# Patient Record
Sex: Male | Born: 1935 | Race: White | Hispanic: No | Marital: Married | Smoking: Former smoker
Health system: Southern US, Community
[De-identification: ages and names within clinical notes are randomized; demographics above are authoritative.]

## PROBLEM LIST (undated history)

## (undated) DIAGNOSIS — I10 Essential (primary) hypertension: Secondary | ICD-10-CM

## (undated) DIAGNOSIS — E785 Hyperlipidemia, unspecified: Secondary | ICD-10-CM

## (undated) DIAGNOSIS — J449 Chronic obstructive pulmonary disease, unspecified: Secondary | ICD-10-CM

## (undated) DIAGNOSIS — I480 Paroxysmal atrial fibrillation: Secondary | ICD-10-CM

## (undated) DIAGNOSIS — I739 Peripheral vascular disease, unspecified: Secondary | ICD-10-CM

## (undated) HISTORY — DX: Paroxysmal atrial fibrillation: I48.0

## (undated) HISTORY — DX: Essential (primary) hypertension: I10

## (undated) HISTORY — DX: Peripheral vascular disease, unspecified: I73.9

## (undated) HISTORY — DX: Hyperlipidemia, unspecified: E78.5

---

## 2011-06-07 ENCOUNTER — Inpatient Hospital Stay (INDEPENDENT_AMBULATORY_CARE_PROVIDER_SITE_OTHER)
Admission: RE | Admit: 2011-06-07 | Discharge: 2011-06-07 | Disposition: A | Discharge status: Home / Self Care | Payer: MEDICARE | Source: Ambulatory Visit | Attending: Family Medicine | Admitting: Family Medicine

## 2011-06-07 ENCOUNTER — Encounter: Payer: Self-pay | Admitting: Family Medicine

## 2011-06-07 DIAGNOSIS — J449 Chronic obstructive pulmonary disease, unspecified: Secondary | ICD-10-CM | POA: Insufficient documentation

## 2011-06-07 DIAGNOSIS — R05 Cough: Secondary | ICD-10-CM

## 2011-06-10 ENCOUNTER — Telehealth (INDEPENDENT_AMBULATORY_CARE_PROVIDER_SITE_OTHER): Payer: Self-pay | Admitting: Emergency Medicine

## 2011-06-29 ENCOUNTER — Ambulatory Visit
Admission: RE | Admit: 2011-06-29 | Discharge: 2011-06-29 | Disposition: A | Discharge status: Home / Self Care | Payer: MEDICARE | Source: Ambulatory Visit | Attending: Emergency Medicine | Admitting: Emergency Medicine

## 2011-06-29 ENCOUNTER — Other Ambulatory Visit: Payer: Self-pay | Admitting: Emergency Medicine

## 2011-06-29 ENCOUNTER — Inpatient Hospital Stay (INDEPENDENT_AMBULATORY_CARE_PROVIDER_SITE_OTHER)
Admission: RE | Admit: 2011-06-29 | Discharge: 2011-06-29 | Disposition: A | Discharge status: Home / Self Care | Payer: MEDICARE | Source: Ambulatory Visit | Attending: Emergency Medicine | Admitting: Emergency Medicine

## 2011-06-29 ENCOUNTER — Encounter: Payer: Self-pay | Admitting: Emergency Medicine

## 2011-06-29 DIAGNOSIS — R05 Cough: Secondary | ICD-10-CM

## 2011-06-29 DIAGNOSIS — J069 Acute upper respiratory infection, unspecified: Secondary | ICD-10-CM

## 2011-06-29 DIAGNOSIS — J449 Chronic obstructive pulmonary disease, unspecified: Secondary | ICD-10-CM

## 2011-12-02 NOTE — Progress Notes (Signed)
Summary: chest congestion(rm4)   Vital Signs:  Patient Profile:   77 Years Old Male CC:      chest congestion Height:     70 inches Weight:      162.8 pounds O2 Sat:      93 % O2 treatment:    Room Air Temp:     97.8 degrees F oral Pulse rate:   84 / minute Resp:     24 per minute BP sitting:   156 / 65  (left arm) Cuff size:   regular  Vitals Entered By: Linton Flemings RN (June 29, 2011 5:04 PM)                  Updated Prior Medication List: SYMBICORT 80-4.5 MCG/ACT AERO (BUDESONIDE-FORMOTEROL FUMARATE)  SPIRIVA HANDIHALER 18 MCG CAPS (TIOTROPIUM BROMIDE MONOHYDRATE)  IOPHEN C-NR 100-10 MG/5ML LIQD (GUAIFENESIN-CODEINE)  PREDNISONE (PAK) 10 MG TABS (PREDNISONE) Take as directed: 6, 5, 4, 3, 2, 1 CHERATUSSIN AC 100-10 MG/5ML SYRP (GUAIFENESIN-CODEINE) 5 to 10 cc by mouth hs as needed cough prednisone started 06/28/11 Current Allergies: No known allergies History of Present Illness History from: patient Chief Complaint: chest congestion History of Present Illness: 77 Years Old Male complains of onset of cold symptoms for a week.  His PCP gave him a steroid shot 2 days ago and started him on Prednisone yesterday but he was reading online and is concerned with pneumonia.  He was also given a Zpak and started that this morning.  He is already taking his COPD meds (spiriva, symbicort, and rescue inhaler as needed). No sore throat + cough No pleuritic pain No wheezing + nasal congestion No post-nasal drainage No sinus pain/pressure +chest congestion No itchy/red eyes No earache No hemoptysis No SOB No chills/sweats + mild fever last 2 nights No nausea No vomiting No abdominal pain No diarrhea No skin rashes No fatigue No myalgias No headache   REVIEW OF SYSTEMS Constitutional Symptoms      Denies fever, chills, night sweats, weight loss, weight gain, and fatigue.  Eyes       Denies change in vision, eye pain, eye discharge, glasses, contact lenses, and eye  surgery. Ear/Nose/Throat/Mouth       Denies hearing loss/aids, change in hearing, ear pain, ear discharge, dizziness, frequent runny nose, frequent nose bleeds, sinus problems, sore throat, hoarseness, and tooth pain or bleeding.  Respiratory       Complains of productive cough.      Denies dry cough, wheezing, shortness of breath, asthma, bronchitis, and emphysema/COPD.  Cardiovascular       Denies murmurs, chest pain, and tires easily with exhertion.    Gastrointestinal       Denies stomach pain, nausea/vomiting, diarrhea, constipation, blood in bowel movements, and indigestion. Genitourniary       Denies painful urination, kidney stones, and loss of urinary control. Neurological       Denies paralysis, seizures, and fainting/blackouts. Musculoskeletal       Denies muscle pain, joint pain, joint stiffness, decreased range of motion, redness, swelling, muscle weakness, and gout.  Skin       Denies bruising, unusual mles/lumps or sores, and hair/skin or nail changes.  Psych       Denies mood changes, temper/anger issues, anxiety/stress, speech problems, depression, and sleep problems. Other Comments: symptoms started Wed. seen by PCP and started prednisone on Friday w/out relief   Past History:  Past Medical History: Reviewed history from 06/07/2011 and no changes required. COPD  Past  Surgical History: Reviewed history from 06/07/2011 and no changes required. Denies surgical history  Family History: Reviewed history from 06/07/2011 and no changes required. Father, D, colon CA Physical Exam General appearance: well developed, well nourished, no acute distress, hard of hearing Ears: normal, no lesions or deformities Nasal: mucosa pink, nonedematous, no septal deviation, turbinates normal Oral/Pharynx: tongue normal, posterior pharynx without erythema or exudate Chest/Lungs: no rales, wheezes, or rhonchi bilateral, breath sounds equal without effort Heart: regular rate and   rhythm, no murmur MSE: oriented to time, place, and person Assessment New Problems: UPPER RESPIRATORY INFECTION, ACUTE (ICD-465.9) COUGH (ICD-786.2)   Patient Education: Patient and/or caregiver instructed in the following: rest.  Plan New Orders: Est. Patient Level IV [16109] T-Chest x-ray, 2 views [71020] Pulse Oximetry (single measurment) [94760] Planning Comments:   A CXR was ordered which shows Emphysema without acute finding. His PCP already has him on the correct medicines.   Use nasal saline solution (over the counter) at least 3 times a day.Can use OTC decongestants as needed. Can take tylenol or motrin for pain or fever. Follow up with your primary doctor  if no improvement in 5-7 days, sooner if increasing pain, fever, or new symptoms.    The patient and/or caregiver has been counseled thoroughly with regard to medications prescribed including dosage, schedule, interactions, rationale for use, and possible side effects and they verbalize understanding.  Diagnoses and expected course of recovery discussed and will return if not improved as expected or if the condition worsens. Patient and/or caregiver verbalized understanding.   Orders Added: 1)  Est. Patient Level IV [60454] 2)  T-Chest x-ray, 2 views [71020] 3)  Pulse Oximetry (single measurment) [09811]

## 2011-12-02 NOTE — Progress Notes (Signed)
Summary: CHEST CONGESTION COPD Room 5   Vital Signs:  Patient Profile:   77 Years Old Male CC:      COPD/Troble breathing, mucus x 2 days Height:     70 inches Weight:      164 pounds O2 Sat:      97 % O2 treatment:    Room Air Temp:     98.2 degrees F oral Pulse rate:   88 / minute Pulse rhythm:   regular Resp:     20 per minute BP sitting:   201 / 87  (left arm) Cuff size:   regular  Vitals Entered By: Emilio Math (June 07, 2011 3:14 PM)                  Current Allergies: No known allergies History of Present Illness Chief Complaint: COPD/Troble breathing, mucus x 2 days History of Present Illness:  Subjective:  Patient complains of a history of COPD, worse over the past year but generally controlled with Symbicort, Spiriva, and Ventolin inhalers.  On June 3, he had worsening congestion at night and his PCP treated him with a 6 day course of tapering prednisone.  He improved siginificantly but over the past two nights he has had increased cough,  mucous production and dyspnea at night, and would like to extend his prednisone treatment.  He uses O2 at night (1 to 2 L/M).   He reports much less chest congestion daytime.  No fevers, chills, and sweats.  No pleuritic pain.    REVIEW OF SYSTEMS Constitutional Symptoms      Denies fever, chills, night sweats, weight loss, weight gain, and fatigue.  Eyes       Denies change in vision, eye pain, eye discharge, glasses, contact lenses, and eye surgery. Ear/Nose/Throat/Mouth       Denies hearing loss/aids, change in hearing, ear pain, ear discharge, dizziness, frequent runny nose, frequent nose bleeds, sinus problems, sore throat, hoarseness, and tooth pain or bleeding.  Respiratory       Complains of productive cough, wheezing, shortness of breath, bronchitis, and emphysema/COPD.      Denies dry cough and asthma.  Cardiovascular       Denies murmurs, chest pain, and tires easily with exhertion.    Gastrointestinal  Denies stomach pain, nausea/vomiting, diarrhea, constipation, blood in bowel movements, and indigestion. Genitourniary       Denies painful urination, kidney stones, and loss of urinary control. Neurological       Denies paralysis, seizures, and fainting/blackouts. Musculoskeletal       Denies muscle pain, joint pain, joint stiffness, decreased range of motion, redness, swelling, muscle weakness, and gout.  Skin       Denies bruising, unusual mles/lumps or sores, and hair/skin or nail changes.  Psych       Denies mood changes, temper/anger issues, anxiety/stress, speech problems, depression, and sleep problems.  Past History:  Past Medical History: COPD  Past Surgical History: Denies surgical history  Family History: Father, D, colon CA  Social History: Quit smoker 10 yrs ago, smoked 50 yrs No ETOH No DRugs Retired   Objective:  Appearance:  Patient appears stated age, and in no acute distress.  He is alert and oriented  Eyes:  Pupils are equal, round, and reactive to light and accomodation.  Extraocular movement is intact.  Conjunctivae are not inflamed.  Ears:  Canals normal.  Tympanic membranes normal.   Nose:  Mildly congested turbinates.  No sinus tenderness  Pharynx:  Normal  Neck:  Supple.  No adenopathy is present.   Lungs:  Generally clear to auscultation.  Breath sounds are equal.  Heart:  Regular rate and rhythm without murmurs, rubs, or gallops.  Abdomen:  Nontender without masses or hepatosplenomegaly.  Bowel sounds are present.  No CVA or flank tenderness.  Extremities:  No edema.  Pedal pulses are full and equal.  Assessment New Problems: COUGH (ICD-786.2) COPD (ICD-496)   Plan New Medications/Changes: CHERATUSSIN AC 100-10 MG/5ML SYRP (GUAIFENESIN-CODEINE) 5 to 10 cc by mouth hs as needed cough  #4oz x 0, 06/07/2011, Donna Christen MD PREDNISONE (PAK) 10 MG TABS (PREDNISONE) Take as directed: 6, 5, 4, 3, 2, 1  #21 tabs x 0, 06/07/2011, Donna Christen  MD  New Orders: Depo- Medrol 80mg  [J1040] Admin of Therapeutic Inj  intramuscular or subcutaneous [96372] Pulse Oximetry (single measurment) [94760] New Patient Level IV [16109] Planning Comments:   Depo Medrol 80mg  IM; tomorrow begin tapering course of prednisone.  Recommend Mucinex Continue inhalers. Follow-up with PCP  The patient and/or caregiver has been counseled thoroughly with regard to medications prescribed including dosage, schedule, interactions, rationale for use, and possible side effects and they verbalize understanding.  Diagnoses and expected course of recovery discussed and will return if not improved as expected or if the condition worsens. Patient and/or caregiver verbalized understanding.  Prescriptions: CHERATUSSIN AC 100-10 MG/5ML SYRP (GUAIFENESIN-CODEINE) 5 to 10 cc by mouth hs as needed cough  #4oz x 0   Entered and Authorized by:   Donna Christen MD   Signed by:   Donna Christen MD on 06/07/2011   Method used:   Print then Give to Patient   RxID:   [id number] PREDNISONE (PAK) 10 MG TABS (PREDNISONE) Take as directed: 6, 5, 4, 3, 2, 1  #21 tabs x 0   Entered and Authorized by:   Donna Christen MD   Signed by:   Donna Christen MD on 06/07/2011   Method used:   Print then Give to Patient   RxID:   [id number]   Medication Administration  Injection # 1:    Medication: Depo- Medrol 80mg     Diagnosis: COPD (ICD-496)    Route: IM    Site: RUOQ gluteus    Exp Date: 06/30/2011    Lot #: [id number] [id number]    Mfr: Pharmacia    Given by: Emilio Math (June 07, 2011 3:56 PM)  Orders Added: 1)  Depo- Medrol 80mg  [J1040] 2)  Admin of Therapeutic Inj  intramuscular or subcutaneous [96372] 3)  Pulse Oximetry (single measurment) [94760] 4)  New Patient Level IV [99204]     Medication Administration  Injection # 1:    Medication: Depo- Medrol 80mg     Diagnosis: COPD (ICD-496)    Route: IM    Site: RUOQ gluteus    Exp Date: 06/30/2011    Lot #: [id number] [id number]    Mfr: Pharmacia    Given by: Emilio Math (June 07, 2011 3:56 PM)  Orders Added: 1)  Depo- Medrol 80mg  [J1040] 2)  Admin of Therapeutic Inj  intramuscular or subcutaneous [96372] 3)  Pulse Oximetry (single measurment) [94760] 4)  New Patient Level IV [84696]

## 2011-12-02 NOTE — Telephone Encounter (Signed)
  Phone Note Outgoing Call Call back at [facility] Phone [phone]   Call placed by: Emilio Math,  June 10, 2011 12:15 PM Call placed to: Patient Summary of Call: Patient is doing fine, appreciated the care.

## 2012-07-06 ENCOUNTER — Ambulatory Visit: Payer: Medicare Other | Attending: Family Medicine | Admitting: Rehabilitative and Restorative Service Providers"

## 2012-07-06 DIAGNOSIS — R269 Unspecified abnormalities of gait and mobility: Secondary | ICD-10-CM | POA: Insufficient documentation

## 2012-07-06 DIAGNOSIS — IMO0001 Reserved for inherently not codable concepts without codable children: Secondary | ICD-10-CM | POA: Insufficient documentation

## 2012-07-10 ENCOUNTER — Ambulatory Visit: Payer: Medicare Other | Admitting: Rehabilitative and Restorative Service Providers"

## 2012-07-13 ENCOUNTER — Encounter: Payer: MEDICARE | Admitting: Rehabilitative and Restorative Service Providers"

## 2012-07-20 ENCOUNTER — Encounter: Payer: MEDICARE | Admitting: Rehabilitative and Restorative Service Providers"

## 2013-02-20 ENCOUNTER — Emergency Department
Admission: EM | Admit: 2013-02-20 | Discharge: 2013-02-20 | Disposition: A | Discharge status: Home / Self Care | Payer: Medicare Other | Source: Home / Self Care | Attending: Family Medicine | Admitting: Family Medicine

## 2013-02-20 DIAGNOSIS — J441 Chronic obstructive pulmonary disease with (acute) exacerbation: Secondary | ICD-10-CM

## 2013-02-20 DIAGNOSIS — J209 Acute bronchitis, unspecified: Secondary | ICD-10-CM

## 2013-02-20 HISTORY — DX: Chronic obstructive pulmonary disease, unspecified: J44.9

## 2013-02-20 MED ORDER — METHYLPREDNISOLONE ACETATE 80 MG/ML IJ SUSP
80.0000 mg | Freq: Once | INTRAMUSCULAR | Status: AC
  Administered 2013-02-20: 80 mg via INTRAMUSCULAR

## 2013-02-20 MED ORDER — PREDNISONE (PAK) 10 MG PO TABS: ORAL_TABLET | ORAL | Status: DC

## 2013-02-20 MED ORDER — DOXYCYCLINE HYCLATE 100 MG PO CAPS: 100.0000 mg | ORAL_CAPSULE | Freq: Two times a day (BID) | ORAL | Status: DC

## 2013-02-20 NOTE — ED Provider Notes (Signed)
History     CSN: [id number]  Arrival date & time 02/20/13  1453   First MD Initiated Contact with Patient 02/20/13 1533      Chief Complaint  Patient presents with  . Shortness of Breath       HPI Comments: Patient complains of increased cough and shortness of breath for 3 days.  He has had increased production of tan sputum.  No fevers, chills, and sweats but he has been more fatigued past several days.  No pleuritic pain.  No other URI symptoms.  His last exacerbation of COPD was one month ago.  The history is provided by the patient and the spouse.    Past Medical History  Diagnosis Date  . COPD (chronic obstructive pulmonary disease)     History reviewed. No pertinent past surgical history.  Family History  Problem Relation Age of Onset  . Heart failure Mother   . Cancer Father     History  Substance Use Topics  . Smoking status: Former Games developer  . Smokeless tobacco: Not on file  . Alcohol Use: No      Review of Systems No sore throat + cough No pleuritic pain No wheezing No nasal congestion ? post-nasal drainage No sinus pain/pressure No itchy/red eyes No earache No hemoptysis + SOB No fever/chills No nausea No vomiting No abdominal pain No diarrhea No urinary symptoms No skin rashes + fatigue No myalgias No headache    Allergies  Review of patient's allergies indicates no active allergies.  Home Medications   Current Outpatient Rx  Name  Route  Sig  Dispense  Refill  . albuterol (PROVENTIL HFA;VENTOLIN HFA) 108 (90 BASE) MCG/ACT inhaler   Inhalation   Inhale 2 puffs into the lungs every 6 (six) hours as needed for wheezing.         . budesonide-formoterol (SYMBICORT) 80-4.5 MCG/ACT inhaler   Inhalation   Inhale 2 puffs into the lungs 2 (two) times daily.         Marland Kitchen tiotropium (SPIRIVA) 18 MCG inhalation capsule   Inhalation   Place 18 mcg into inhaler and inhale daily.         Marland Kitchen doxycycline (VIBRAMYCIN) 100 MG capsule    Oral   Take 1 capsule (100 mg total) by mouth 2 (two) times daily.   20 capsule   0   . predniSONE (STERAPRED UNI-PAK) 10 MG tablet      Take by mouth with food over 6 days as follows:  6, 5, 4, 3, 2, 1   21 tablet   0     BP 153/73  Pulse 87  Temp(Src) 98.1 F (36.7 C) (Oral)  Ht 5\' 10"  (1.778 m)  Wt 153 lb 8 oz (69.627 kg)  BMI 22.02 kg/m2  SpO2 95%  Physical Exam Nursing notes and Vital Signs reviewed. Appearance:  Patient appears stated age, and in no acute distress.  He has decreased hearing. Eyes:  Pupils are equal, round, and reactive to light and accomodation.  Extraocular movement is intact.  Conjunctivae are not inflamed  Ears:  Canals normal.  Tympanic membranes normal.  Nose:  Normal turbinates.  No sinus tenderness.  Pharynx:  Normal Neck:  Supple.  No adenopathy Lungs:  Clear to auscultation, but breath sounds are tubular.  Breath sounds are equal.  Heart:  Regular rate and rhythm without murmurs, rubs, or gallops.  Abdomen:  Nontender without masses or hepatosplenomegaly.  Bowel sounds are present.  No CVA or flank tenderness.  Extremities:  No edema.  No calf tenderness Skin:  No rash present.   ED Course  Procedures  none      1. COPD exacerbation   2. Acute bronchitis       MDM  DepoMedrol 80mg  IM Begin Doxycycline, and tomorrow begin 6 day Prednisone 10mg  pack  Continue all inhalers as prescribed.  May take plain Mucinex daytime. If symptoms become significantly worse during the night or over the weekend, proceed to the local emergency room Followup with PCP        Lattie Haw, MD 02/20/13 1626

## 2013-02-20 NOTE — ED Notes (Signed)
Hx. Of COPD, states increased SHOB started 3-4 days without fever

## 2013-03-01 ENCOUNTER — Ambulatory Visit: Payer: Medicare Other | Admitting: Physician Assistant

## 2013-03-03 ENCOUNTER — Encounter: Payer: Self-pay | Admitting: Physician Assistant

## 2013-03-03 ENCOUNTER — Ambulatory Visit (INDEPENDENT_AMBULATORY_CARE_PROVIDER_SITE_OTHER): Payer: Medicare Other | Admitting: Physician Assistant

## 2013-03-03 VITALS — BP 130/74 | HR 84 | Ht 70.0 in | Wt 155.0 lb

## 2013-03-03 DIAGNOSIS — J441 Chronic obstructive pulmonary disease with (acute) exacerbation: Secondary | ICD-10-CM

## 2013-03-03 DIAGNOSIS — I499 Cardiac arrhythmia, unspecified: Secondary | ICD-10-CM

## 2013-03-03 DIAGNOSIS — I4891 Unspecified atrial fibrillation: Secondary | ICD-10-CM

## 2013-03-03 MED ORDER — BUDESONIDE-FORMOTEROL FUMARATE 80-4.5 MCG/ACT IN AERO: 2.0000 | INHALATION_SPRAY | Freq: Two times a day (BID) | RESPIRATORY_TRACT | Status: DC

## 2013-03-03 MED ORDER — ALBUTEROL SULFATE HFA 108 (90 BASE) MCG/ACT IN AERS: 2.0000 | INHALATION_SPRAY | Freq: Four times a day (QID) | RESPIRATORY_TRACT | Status: DC | PRN

## 2013-03-03 MED ORDER — TIOTROPIUM BROMIDE MONOHYDRATE 18 MCG IN CAPS: 18.0000 ug | ORAL_CAPSULE | Freq: Every day | RESPIRATORY_TRACT | Status: DC

## 2013-03-03 NOTE — Patient Instructions (Addendum)
Needs to go back to cardiologist [name].   Medications refill.

## 2013-03-04 NOTE — Progress Notes (Signed)
  Subjective:    Patient ID: Kenneth Lin, male    DOB: [birth year], 79 y.o.   MRN: [id number]  HPI Patient presents to the clinic to establish care. He is a 79 yo male who is very pleasant. PMH positive for COPD and recent atrial fibrillation. He is here to confirm heart rate is still irregular and to get refills on medication.   COPD is well controlled today. He does have a few exacerbations a year. His most recent exacerbation he went to our Urgent Care. He uses his inhaler regularly. On average he does not need albuterol inhaler maybe once a week. Weather changes tend to affect his breathing the most. He tries to stay active.   Recently he was told he has atrial fibrillation. He went to a cardiologist and they wanted to do a procedure and start on anti coagulation. He is not on board with this treatment. He did start a daily ASA. He denies any SOB. Sometimes it does feel like his heart is fluttering. Denies any weakness of extremities.   Family, social, surgical history was reviewed.   Never had colonoscopy and does not want one.     Review of Systems  All other systems reviewed and are negative.       Objective:   Physical Exam  Constitutional: He is oriented to person, place, and time. He appears well-developed and well-nourished.  HENT:  Head: Normocephalic and atraumatic.  Right Ear: External ear normal.  Left Ear: External ear normal.  Eyes: Conjunctivae are normal. Right eye exhibits no discharge. Left eye exhibits no discharge.  Neck: Normal range of motion. Neck supple. No thyromegaly present.  Cardiovascular: Normal heart sounds.   Irregular Irregular rate.  Pulmonary/Chest: Effort normal.  Diminished Breath sounds bilaterally.  Lymphadenopathy:    He has no cervical adenopathy.  Neurological: He is alert and oriented to person, place, and time.  Skin: Skin is warm and dry.  Psychiatric: He has a normal mood and affect. His behavior is normal.           Assessment & Plan:  COPD- Pt well controlled today. Refilled all meds. Will continue to monitor. Follow up in 3 months.   Atrial fibrillation- Pt aware. He has seen a cardiologist, [name]. Per patient cardiologist is wanting him to start anti coagulation but patient does not want too. He is taking a daily ASA 81mg . Encouraged pt to schedule appt in near future and that I think the idea of anticoagulation is a good idea to prevent clot. Risk of not anti-coagulating was discussed with patient.

## 2013-03-10 ENCOUNTER — Encounter: Payer: Self-pay | Admitting: Physician Assistant

## 2013-03-10 DIAGNOSIS — I739 Peripheral vascular disease, unspecified: Secondary | ICD-10-CM | POA: Insufficient documentation

## 2013-03-10 DIAGNOSIS — I10 Essential (primary) hypertension: Secondary | ICD-10-CM | POA: Insufficient documentation

## 2013-03-10 DIAGNOSIS — I480 Paroxysmal atrial fibrillation: Secondary | ICD-10-CM | POA: Insufficient documentation

## 2013-03-10 DIAGNOSIS — E785 Hyperlipidemia, unspecified: Secondary | ICD-10-CM | POA: Insufficient documentation

## 2013-05-23 ENCOUNTER — Encounter: Payer: Self-pay | Admitting: Emergency Medicine

## 2013-05-23 ENCOUNTER — Emergency Department
Admission: EM | Admit: 2013-05-23 | Discharge: 2013-05-23 | Disposition: A | Discharge status: Home / Self Care | Payer: Medicare Other | Source: Home / Self Care | Attending: Family Medicine | Admitting: Family Medicine

## 2013-05-23 DIAGNOSIS — J441 Chronic obstructive pulmonary disease with (acute) exacerbation: Secondary | ICD-10-CM

## 2013-05-23 DIAGNOSIS — J069 Acute upper respiratory infection, unspecified: Secondary | ICD-10-CM

## 2013-05-23 MED ORDER — DOXYCYCLINE HYCLATE 100 MG PO CAPS: 100.0000 mg | ORAL_CAPSULE | Freq: Two times a day (BID) | ORAL | Status: AC

## 2013-05-23 MED ORDER — METHYLPREDNISOLONE SODIUM SUCC 125 MG IJ SOLR
125.0000 mg | Freq: Once | INTRAMUSCULAR | Status: AC
  Administered 2013-05-23: 125 mg via INTRAMUSCULAR

## 2013-05-23 MED ORDER — IPRATROPIUM BROMIDE 0.02 % IN SOLN
0.5000 mg | Freq: Once | RESPIRATORY_TRACT | Status: AC
  Administered 2013-05-23: 0.5 mg via RESPIRATORY_TRACT

## 2013-05-23 MED ORDER — PREDNISONE 50 MG PO TABS: ORAL_TABLET | ORAL | Status: DC

## 2013-05-23 NOTE — ED Notes (Signed)
Reports exacerbation of COPD x 24 hours; wonders if allergies contributing.

## 2013-05-23 NOTE — ED Provider Notes (Signed)
History     CSN: [id number]  Arrival date & time 05/23/13  1706   First MD Initiated Contact with Patient 05/23/13 1711      Chief Complaint  Patient presents with  . Shortness of Breath   HPI  URI Symptoms Onset: 2 days  Description: rhinorrhea, nasal congestion, cough, wheezing, increased sputum production.  Modifying factors:  Baseline COPD   Symptoms Nasal discharge: yes Fever: no Sore throat: no Cough: yes Wheezing: yes Ear pain: no GI symptoms: no Sick contacts: no  Red Flags  Stiff neck: no Dyspnea: mild Rash: no Swallowing difficulty: no  Sinusitis Risk Factors Headache/face pain: no Double sickening: no tooth pain: no  Allergy Risk Factors Sneezing: no Itchy scratchy throat: no Seasonal symptoms: no  Flu Risk Factors Headache: no muscle aches: no severe fatigue: no   Past Medical History  Diagnosis Date  . COPD (chronic obstructive pulmonary disease)     History reviewed. No pertinent past surgical history.  Family History  Problem Relation Age of Onset  . Heart failure Mother   . Cancer Father     History  Substance Use Topics  . Smoking status: Former Games developer  . Smokeless tobacco: Not on file  . Alcohol Use: No      Review of Systems  All other systems reviewed and are negative.    Allergies  Review of patient's allergies indicates no known allergies.  Home Medications   Current Outpatient Rx  Name  Route  Sig  Dispense  Refill  . albuterol (PROVENTIL HFA;VENTOLIN HFA) 108 (90 BASE) MCG/ACT inhaler   Inhalation   Inhale 2 puffs into the lungs every 6 (six) hours as needed for wheezing.   1 Inhaler   6   . budesonide-formoterol (SYMBICORT) 80-4.5 MCG/ACT inhaler   Inhalation   Inhale 2 puffs into the lungs 2 (two) times daily.   1 Inhaler   5   . tiotropium (SPIRIVA) 18 MCG inhalation capsule   Inhalation   Place 1 capsule (18 mcg total) into inhaler and inhale daily.   30 capsule   5     BP 171/93   Pulse 96  Temp(Src) 97.7 F (36.5 C) (Oral)  Resp 20  Ht 5\' 10"  (1.778 m)  Wt 155 lb (70.308 kg)  BMI 22.24 kg/m2  SpO2 98%  Physical Exam  Constitutional: He appears well-developed and well-nourished.  HENT:  Head: Normocephalic and atraumatic.  Right Ear: External ear normal.  Left Ear: External ear normal.  +nasal erythema, rhinorrhea bilaterally, + post oropharyngeal erythema    Eyes: Conjunctivae are normal. Pupils are equal, round, and reactive to light.  Neck: Normal range of motion. Neck supple.  Cardiovascular: Normal rate, regular rhythm and normal heart sounds.   Pulmonary/Chest: Effort normal. No respiratory distress. He has wheezes. He has no rales.  Abdominal: Soft.  Musculoskeletal: Normal range of motion.  Neurological: He is alert.  Skin: Skin is warm.    ED Course  Procedures (including critical care time)  Labs Reviewed - No data to display No results found.   1. COPD exacerbation   2. URI (upper respiratory infection)       MDM  Likely viral induced COPD exacerbation.  Solumedrol 125mg  IM x1 Prednisone and doxy for outpt treatment.  Atrovent neb x1 (avoiding excessive tachycardia in setting of borderline tachycardic HR w/ ? Hx/o afib).  Noticed improved aeration s/p neg treatment. HR remained stable.  Discussed supportive care and resp/infectious red flags.  Call if  any questions.  Follow up as needed.     The patient and/or caregiver has been counseled thoroughly with regard to treatment plan and/or medications prescribed including dosage, schedule, interactions, rationale for use, and possible side effects and they verbalize understanding. Diagnoses and expected course of recovery discussed and will return if not improved as expected or if the condition worsens. Patient and/or caregiver verbalized understanding.             Doree Albee, MD 05/23/13 [phone]

## 2013-05-28 ENCOUNTER — Encounter: Payer: Self-pay | Admitting: Physician Assistant

## 2013-05-28 ENCOUNTER — Ambulatory Visit (INDEPENDENT_AMBULATORY_CARE_PROVIDER_SITE_OTHER): Payer: Medicare Other | Admitting: Physician Assistant

## 2013-05-28 VITALS — BP 156/99 | HR 63 | Wt 158.0 lb

## 2013-05-28 DIAGNOSIS — M25559 Pain in unspecified hip: Secondary | ICD-10-CM

## 2013-05-28 DIAGNOSIS — J449 Chronic obstructive pulmonary disease, unspecified: Secondary | ICD-10-CM

## 2013-05-28 DIAGNOSIS — M25551 Pain in right hip: Secondary | ICD-10-CM

## 2013-05-28 DIAGNOSIS — Z131 Encounter for screening for diabetes mellitus: Secondary | ICD-10-CM

## 2013-05-28 DIAGNOSIS — R1013 Epigastric pain: Secondary | ICD-10-CM

## 2013-05-28 DIAGNOSIS — Z13220 Encounter for screening for lipoid disorders: Secondary | ICD-10-CM

## 2013-05-28 MED ORDER — DIAZEPAM 5 MG PO TABS: 5.0000 mg | ORAL_TABLET | Freq: Every evening | ORAL | Status: DC | PRN

## 2013-05-28 NOTE — Patient Instructions (Addendum)
Continue on inhalers.   Start zyrtec or claritin 24 hour for allergies.   Ok nexium OTC.

## 2013-05-28 NOTE — Progress Notes (Signed)
  Subjective:    Patient ID: Kenneth Lin, male    DOB: 1930/04/21, 79 y.o.   MRN: [id number]  HPI Patient presents to the clinic to follow up after his COPD exacerbation. He feels much better and breathing great. Remains on Spiriva and symbicort. Not using albuterol inhaler currently. Still taking antibiotic.   He does have some epigastric discomfort after eating big meals. He used to be on prilosec and did help. He wants to know if nexium would be ok to seeling much better.Denies any n/v/d. No coughing or blood.   Patient also comes in talking about symptoms of blood clot in right hip. He read an article about the only symptom being burning. He has had burning for over a year. Not taking anything to make better. Nothing makes worse. He would like a ultrasound of hip to look for clot. Taking ASA daily. At some point he was given gabapentin and it did help the pain.    Review of Systems     Objective:   Physical Exam  Constitutional: He is oriented to person, place, and time. He appears well-developed and well-nourished.  HENT:  Head: Normocephalic and atraumatic.  Eyes: Conjunctivae are normal.  Neck: Normal range of motion. Neck supple.  Cardiovascular: Normal rate, regular rhythm and normal heart sounds.   Pulmonary/Chest: Effort normal and breath sounds normal.  Abdominal: Soft. Bowel sounds are normal. He exhibits no distension and no mass. There is no tenderness. There is no rebound and no guarding.  Musculoskeletal:  Right hip: no swelling or heat. No pain with palpation. Full ROM. Strength 5/5.   Neurological: He is alert and oriented to person, place, and time.  Skin: Skin is warm and dry.  Psychiatric: He has a normal mood and affect. His behavior is normal.          Assessment & Plan:  COPD- Continue on meds. Controlled today. Follow up if starting to use rescue inhaler more.   Afib- Encouraged pt to see cardiologist.   GERD- Ok to use OTC nexium see if helps  epigastric discomfort. Follow up if not improving.  Right hip burning- Discussed not likely a clot since no swelling, heat and responding to neurotin. Let's get d-dimer and if elevated can get ultrasound if not elevated will hold off and can start gabapentin again or other nerve medication. Continue on ASA.  Lab slip given to have screening labs done.    Spent 30 minutes with patient and greater than 50 percent of time spent counseling regarding blood clots and treatment.

## 2013-05-29 LAB — D-DIMER, QUANTITATIVE: D-Dimer, Quant: 0.46 ug/mL-FEU (ref 0.00–0.48)

## 2013-06-04 ENCOUNTER — Ambulatory Visit: Payer: Medicare Other | Admitting: Physician Assistant

## 2013-06-25 ENCOUNTER — Encounter: Payer: Self-pay | Admitting: Physician Assistant

## 2013-06-25 ENCOUNTER — Ambulatory Visit (INDEPENDENT_AMBULATORY_CARE_PROVIDER_SITE_OTHER): Payer: Medicare Other | Admitting: Physician Assistant

## 2013-06-25 VITALS — BP 150/84 | HR 88

## 2013-06-25 DIAGNOSIS — J441 Chronic obstructive pulmonary disease with (acute) exacerbation: Secondary | ICD-10-CM

## 2013-06-25 DIAGNOSIS — I10 Essential (primary) hypertension: Secondary | ICD-10-CM

## 2013-06-25 DIAGNOSIS — H60399 Other infective otitis externa, unspecified ear: Secondary | ICD-10-CM

## 2013-06-25 DIAGNOSIS — I4891 Unspecified atrial fibrillation: Secondary | ICD-10-CM

## 2013-06-25 DIAGNOSIS — H6092 Unspecified otitis externa, left ear: Secondary | ICD-10-CM

## 2013-06-25 MED ORDER — PREDNISONE 50 MG PO TABS: ORAL_TABLET | ORAL | Status: DC

## 2013-06-25 MED ORDER — DOXYCYCLINE HYCLATE 100 MG PO CAPS: 100.0000 mg | ORAL_CAPSULE | Freq: Two times a day (BID) | ORAL | Status: DC

## 2013-06-25 MED ORDER — BUDESONIDE-FORMOTEROL FUMARATE 160-4.5 MCG/ACT IN AERO: 2.0000 | INHALATION_SPRAY | Freq: Two times a day (BID) | RESPIRATORY_TRACT | Status: DC

## 2013-06-25 MED ORDER — CIPROFLOXACIN-DEXAMETHASONE 0.3-0.1 % OT SUSP: 4.0000 [drp] | Freq: Two times a day (BID) | OTIC | Status: DC

## 2013-06-25 MED ORDER — METHYLPREDNISOLONE SODIUM SUCC 125 MG IJ SOLR
125.0000 mg | Freq: Once | INTRAMUSCULAR | Status: AC
  Administered 2013-06-25: 125 mg via INTRAMUSCULAR

## 2013-06-25 MED ORDER — ASPIRIN EC 81 MG PO TBEC: 81.0000 mg | DELAYED_RELEASE_TABLET | Freq: Every day | ORAL | Status: DC

## 2013-06-25 NOTE — Progress Notes (Signed)
  Subjective:    Patient ID: Kenneth Lin, male    DOB: [birth year], 79 y.o.   MRN: [id number]  HPI Patient is a 79 yo male who presents to the clinic with left ear pain/congestion/pressure and difficultly breathing.   Left ear pain/congestion/pressure has been going on for around a week. Denies any fever, chills, nausea, vomiting diarrhea. Denies any sinus pressure. Ear aches to touch left ear. Denies any discharge.   Pt has COPD and feels like a flare has started. He feels like he has had more flares since decreasing symbicort down to 80. He has noticed he has started wheezing and chest feeling tight. He rarely uses albuterol nebulizer unless he is in a flare and he has had to use it 3 times yesterday. He is very compliant with spiriva and symbicort daily. No fever. Cough is productive with a whitish sputum.   Atrial fibrillation is still ongoing and patient has not been seen by cardiologist. He does not want to go back to cardiologist at [facility]. He denies any symptoms of chest pain, palpitations, difficultly completing tasks.     Review of Systems     Objective:   Physical Exam  Constitutional: He is oriented to person, place, and time. He appears well-developed and well-nourished.  HENT:  Head: Normocephalic and atraumatic.  Right Ear: External ear normal.  Nose: Nose normal.  Mouth/Throat: Oropharynx is clear and moist. No oropharyngeal exudate.  Left TM erythematous with a slight bulge. Canal is red and tragus tender to touch.   Neck: Normal range of motion. Neck supple.  Cardiovascular: Normal heart sounds.   Afib noted on exam. Irregular Irregular beat.   Pulmonary/Chest:  Decreased breath sounds. Wheezing at base of both lungs.   Abdominal: Soft. Bowel sounds are normal. There is no tenderness.  Lymphadenopathy:    He has no cervical adenopathy.  Neurological: He is alert and oriented to person, place, and time.  Skin: Skin is warm and dry.  Psychiatric: He has a  normal mood and affect. His behavior is normal.          Assessment & Plan:  Left external otitis- Treated with ciprodex 4 drops twice a day for 7 days. Gave HO on ways to help pressure and pain.   COPD exacerbation- Given Solumedrol 125mg  in office. Prednisone burst given. Doxy for 10 days. Continue to use albuterol inhaler as needed. Increased symbicort to 160 2 puffs BID since exacerbations have been more frequent. Follow up in 1 month. Samples given to assist patient.   HTN- Pt's BP is elevated again today. He claims white coat syndrome. I would like to recheck in 1 month. He is also going to start taking BP on a regular basis and bring in log. He is also going to bring in BP machine to check accuracy.   A fib- Discussed with patient importance of follow up with cardiologist. He agreed to go downstairs to see Dr. Jens Som. Will refer. Continues on ASA.

## 2013-06-25 NOTE — Patient Instructions (Addendum)
Will get in with dr. Jens Som for afib.  Increased symbicort to 160 2 puffs twice a day.   Otitis Externa Otitis externa is a bacterial or fungal infection of the outer ear canal. This is the area from the eardrum to the outside of the ear. Otitis externa is sometimes called "swimmer's ear." CAUSES  Possible causes of infection include:  Swimming in dirty water.  Moisture remaining in the ear after swimming or bathing.  Mild injury (trauma) to the ear.  Objects stuck in the ear (foreign body).  Cuts or scrapes (abrasions) on the outside of the ear. SYMPTOMS  The first symptom of infection is often itching in the ear canal. Later signs and symptoms may include swelling and redness of the ear canal, ear pain, and yellowish-white fluid (pus) coming from the ear. The ear pain may be worse when pulling on the earlobe. DIAGNOSIS  Your caregiver will perform a physical exam. A sample of fluid may be taken from the ear and examined for bacteria or fungi. TREATMENT  Antibiotic ear drops are often given for 10 to 14 days. Treatment may also include pain medicine or corticosteroids to reduce itching and swelling. PREVENTION   Keep your ear dry. Use the corner of a towel to absorb water out of the ear canal after swimming or bathing.  Avoid scratching or putting objects inside your ear. This can damage the ear canal or remove the protective wax that lines the canal. This makes it easier for bacteria and fungi to grow.  Avoid swimming in lakes, polluted water, or poorly chlorinated pools.  You may use ear drops made of rubbing alcohol and vinegar after swimming. Combine equal parts of white vinegar and alcohol in a bottle. Put 3 or 4 drops into each ear after swimming. HOME CARE INSTRUCTIONS   Apply antibiotic ear drops to the ear canal as prescribed by your caregiver.  Only take over-the-counter or prescription medicines for pain, discomfort, or fever as directed by your caregiver.  If you  have diabetes, follow any additional treatment instructions from your caregiver.  Keep all follow-up appointments as directed by your caregiver. SEEK MEDICAL CARE IF:   You have a fever.  Your ear is still red, swollen, painful, or draining pus after 3 days.  Your redness, swelling, or pain gets worse.  You have a severe headache.  You have redness, swelling, pain, or tenderness in the area behind your ear. MAKE SURE YOU:   Understand these instructions.  Will watch your condition.  Will get help right away if you are not doing well or get worse. Document Released: 12/16/2005 Document Revised: 03/09/2012 Document Reviewed: 01/02/2012 [facility] Patient Information 2014 [place], [place].

## 2013-07-01 ENCOUNTER — Telehealth: Payer: Self-pay | Admitting: Physician Assistant

## 2013-07-01 NOTE — Telephone Encounter (Signed)
Patient called because he has not heard anything about his Card referral and said that you were going to put it in a referral for him to see Dr.Crenshaw. Thanks

## 2013-07-05 ENCOUNTER — Other Ambulatory Visit: Payer: Self-pay | Admitting: Physician Assistant

## 2013-07-05 DIAGNOSIS — I4891 Unspecified atrial fibrillation: Secondary | ICD-10-CM

## 2013-07-05 NOTE — Telephone Encounter (Signed)
Put referral in. 

## 2013-07-23 ENCOUNTER — Encounter: Payer: Self-pay | Admitting: Physician Assistant

## 2013-07-23 ENCOUNTER — Ambulatory Visit (INDEPENDENT_AMBULATORY_CARE_PROVIDER_SITE_OTHER): Payer: Self-pay | Admitting: Physician Assistant

## 2013-07-23 VITALS — BP 173/95 | HR 80 | Wt 163.0 lb

## 2013-07-23 DIAGNOSIS — H9193 Unspecified hearing loss, bilateral: Secondary | ICD-10-CM

## 2013-07-23 DIAGNOSIS — I4891 Unspecified atrial fibrillation: Secondary | ICD-10-CM

## 2013-07-23 DIAGNOSIS — Z13220 Encounter for screening for lipoid disorders: Secondary | ICD-10-CM

## 2013-07-23 DIAGNOSIS — Z79899 Other long term (current) drug therapy: Secondary | ICD-10-CM

## 2013-07-23 DIAGNOSIS — I480 Paroxysmal atrial fibrillation: Secondary | ICD-10-CM

## 2013-07-23 DIAGNOSIS — H9190 Unspecified hearing loss, unspecified ear: Secondary | ICD-10-CM

## 2013-07-23 DIAGNOSIS — I10 Essential (primary) hypertension: Secondary | ICD-10-CM

## 2013-07-23 DIAGNOSIS — Z131 Encounter for screening for diabetes mellitus: Secondary | ICD-10-CM

## 2013-07-23 LAB — COMPLETE METABOLIC PANEL WITH GFR
Albumin: 4 g/dL (ref 3.5–5.2)
BUN: 15 mg/dL (ref 6–23)
Calcium: 9.4 mg/dL (ref 8.4–10.5)
Chloride: 104 mEq/L (ref 96–112)
GFR, Est African American: 82 mL/min
GFR, Est Non African American: 71 mL/min
Glucose, Bld: 99 mg/dL (ref 70–99)
Potassium: 4.8 mEq/L (ref 3.5–5.3)

## 2013-07-23 LAB — LIPID PANEL
Cholesterol: 204 mg/dL — ABNORMAL HIGH (ref 0–200)
Total CHOL/HDL Ratio: 3.8 Ratio

## 2013-07-23 LAB — CBC
HCT: 41.3 % (ref 39.0–52.0)
MCH: 28 pg (ref 26.0–34.0)
MCHC: 33.7 g/dL (ref 30.0–36.0)
MCV: 83.3 fL (ref 78.0–100.0)
WBC: 6.2 10*3/uL (ref 4.0–10.5)

## 2013-07-23 NOTE — Progress Notes (Signed)
  Subjective:    Patient ID: Kenneth Lin, male    DOB: 19-Dec-1930, 79 y.o.   MRN: [id number]  HPI Patient is a pleasant 79 yo male who presents to the clinic to follow up on blood pressure. Blood pressure remains high in office but brings in logs of BP between 120-130 systolic and 70-89 diastolic taken at home. He denies any CP. He does have afib and very resistant to anti-coagulation treatment or cardioverson. He has an appt with another cardiologist for a 2nd opinion in August.    COPD very well controlled. At last visit symbicort was increased and he has tolerated very well.   Right hip pain is ongoing. He reports it is more of a burning sensation. lyrica helps but does not want to spend the money because he doesn't have it. Not taking any NSAIDs. Not worse or better at any time pain/burning is constant. Some days he does not have the pain. Denies any back pain. Not worse when walking.       Review of Systems     Objective:   Physical Exam  Constitutional: He is oriented to person, place, and time. He appears well-developed and well-nourished.  HENT:  Head: Normocephalic and atraumatic.  Gross hearing decreased. Had to speak loud and clear for hearing. Left worse than right.  TM's clear.    Cardiovascular: Regular rhythm and normal heart sounds.   Irregular irregular rhythm.   Pulmonary/Chest: Effort normal and breath sounds normal. He has no wheezes.  Musculoskeletal:  Normal gait. No tenderness to palpation over right hip. Negative straight leg test on right side. Normal ROM.   Neurological: He is alert and oriented to person, place, and time.  Skin: Skin is warm and dry.  Psychiatric: He has a normal mood and affect. His behavior is normal.          Assessment & Plan:  HTN/afib- He did not bring in BP cuff today. I suggested to make sure cuff was accurate to bring in cuff. Due to home readings I do not want to add any medications today. Discussed with pt to keep  monitoring at home. Reminded pt of appt with Dr. Jens Som in August. Continue on ASA.  COPD- very controlled today. Stay on increased dose of symbicort. Follow up as needed.   Hearing decreased- gross hearing decreased. Pt is not ready to purchase hearing aids and does not want a referral at this time. I discussed I would give him one at anytime.   Right hip pain- IN office stated xray had been done but was wrong side and last year. I sent msg for pt to be called to get imaging done.  In office I suggested potential injection from Dr. Karie Schwalbe. Pt does not want to try injection at this point. Lyrica helped in the past but he does not want to be on those medications anymore and the cost concerns him. Suggested ibuprofen for the next 2 weeks regularly and to start exercises. If no improvement make appt to see Dr. Karie Schwalbe. Formal PT may be an option and if still not any better may need MRI. Once get new xray will be able to tell pt more.    Pt was given fasting lab slip for screening labs.

## 2013-07-23 NOTE — Patient Instructions (Addendum)
Wednesday August 20th at 3:30. Dr. Jens Som.   Consider audiology referral.   Try ibuprofen 400-600mg  twice a day for 2 weeks.   Hip Exercises RANGE OF MOTION (ROM) AND STRETCHING EXERCISES  These exercises may help you when beginning to rehabilitate your injury. Doing them too aggressively can worsen your condition. Complete them slowly and gently. Your symptoms may resolve with or without further involvement from your physician, physical therapist or athletic trainer. While completing these exercises, remember:   Restoring tissue flexibility helps normal motion to return to the joints. This allows healthier, less painful movement and activity.  An effective stretch should be held for at least 30 seconds.  A stretch should never be painful. You should only feel a gentle lengthening or release in the stretched tissue. If these stretches worsen your symptoms even when done gently, consult your physician, physical therapist or athletic trainer. STRETCH Hamstrings, Supine   Lie on your back. Loop a belt or towel over the ball of your right / left foot.  Straighten your right / left knee and slowly pull on the belt to raise your leg. Do not allow the right / left knee to bend. Keep your opposite leg flat on the floor.  Raise the leg until you feel a gentle stretch behind your right / left knee or thigh. Hold this position for __________ seconds. Repeat __________ times. Complete this stretch __________ times per day.  STRETCH - Hip Rotators   Lie on your back on a firm surface. Grasp your right / left knee with your right / left hand and your ankle with your opposite hand.  Keeping your hips and shoulders firmly planted, gently pull your right / left knee and rotate your lower leg toward your opposite shoulder until you feel a stretch in your buttocks.  Hold this stretch for __________ seconds. Repeat this stretch __________ times. Complete this stretch __________ times per day. STRETCH -  Hamstrings/Adductors, V-Sit   Sit on the floor with your legs extended in a large "V," keeping your knees straight.  With your head and chest upright, bend at your waist reaching for your right foot to stretch your left adductors.  You should feel a stretch in your left inner thigh. Hold for __________ seconds.  Return to the upright position to relax your leg muscles.  Continuing to keep your chest upright, bend straight forward at your waist to stretch your hamstrings.  You should feel a stretch behind both of your thighs and/or knees. Hold for __________ seconds.  Return to the upright position to relax your leg muscles.  Repeat steps 2 through 4 for opposite leg. Repeat __________ times. Complete this exercise __________ times per day.  STRETCHING - Hip Flexors, Lunge  Half kneel with your right / left knee on the floor and your opposite knee bent and directly over your ankle.  Keep good posture with your head over your shoulders. Tighten your buttocks to point your tailbone downward; this will prevent your back from arching too much.  You should feel a gentle stretch in the front of your thigh and/or hip. If you do not feel any resistance, slightly slide your opposite foot forward and then slowly lunge forward so your knee once again lines up over your ankle. Be sure your tailbone remains pointed downward.  Hold this stretch for __________ seconds. Repeat __________ times. Complete this stretch __________ times per day. STRENGTHENING EXERCISES These exercises may help you when beginning to rehabilitate your injury. They may  resolve your symptoms with or without further involvement from your physician, physical therapist or athletic trainer. While completing these exercises, remember:   Muscles can gain both the endurance and the strength needed for everyday activities through controlled exercises.  Complete these exercises as instructed by your physician, physical therapist or  athletic trainer. Progress the resistance and repetitions only as guided.  You may experience muscle soreness or fatigue, but the pain or discomfort you are trying to eliminate should never worsen during these exercises. If this pain does worsen, stop and make certain you are following the directions exactly. If the pain is still present after adjustments, discontinue the exercise until you can discuss the trouble with your clinician. STRENGTH - Hip Extensors, Bridge   Lie on your back on a firm surface. Bend your knees and place your feet flat on the floor.  Tighten your buttocks muscles and lift your bottom off the floor until your trunk is level with your thighs. You should feel the muscles in your buttocks and back of your thighs working. If you do not feel these muscles, slide your feet 1-2 inches further away from your buttocks.  Hold this position for __________ seconds.  Slowly lower your hips to the starting position and allow your buttock muscles relax completely before beginning the next repetition.  If this exercise is too easy, you may cross your arms over your chest. Repeat __________ times. Complete this exercise __________ times per day.  STRENGTH - Hip Abductors, Straight Leg Raises  Be aware of your form throughout the entire exercise so that you exercise the correct muscles. Sloppy form means that you are not strengthening the correct muscles.  Lie on your side so that your head, shoulders, knee and hip line up. You may bend your lower knee to help maintain your balance. Your right / left leg should be on top.  Roll your hips slightly forward, so that your hips are stacked directly over each other and your right / left knee is facing forward.  Lift your top leg up 4-6 inches, leading with your heel. Be sure that your foot does not drift forward or that your knee does not roll toward the ceiling.  Hold this position for __________ seconds. You should feel the muscles in your  outer hip lifting (you may not notice this until your leg begins to tire).  Slowly lower your leg to the starting position. Allow the muscles to fully relax before beginning the next repetition. Repeat __________ times. Complete this exercise __________ times per day.  STRENGTH - Hip Adductors, Straight Leg Raises   Lie on your side so that your head, shoulders, knee and hip line up. You may place your upper foot in front to help maintain your balance. Your right / left leg should be on the bottom.  Roll your hips slightly forward, so that your hips are stacked directly over each other and your right / left knee is facing forward.  Tense the muscles in your inner thigh and lift your bottom leg 4-6 inches. Hold this position for __________ seconds.  Slowly lower your leg to the starting position. Allow the muscles to fully relax before beginning the next repetition. Repeat __________ times. Complete this exercise __________ times per day.  STRENGTH - Quadriceps, Straight Leg Raises  Quality counts! Watch for signs that the quadriceps muscle is working to insure you are strengthening the correct muscles and not "cheating" by substituting with healthier muscles.  Lay on your back  with your right / left leg extended and your opposite knee bent.  Tense the muscles in the front of your right / left thigh. You should see either your knee cap slide up or increased dimpling just above the knee. Your thigh may even quiver.  Tighten these muscles even more and raise your leg 4 to 6 inches off the floor. Hold for right / left seconds.  Keeping these muscles tense, lower your leg.  Relax the muscles slowly and completely in between each repetition. Repeat __________ times. Complete this exercise __________ times per day.  STRENGTH - Hip Abductors, Standing  Tie one end of a rubber exercise band/tubing to a secure surface (table, pole) and tie a loop at the other end.  Place the loop around your  right / left ankle. Keeping your ankle with the band directly opposite of the secured end, step away until there is tension in the tube/band.  Hold onto a chair as needed for balance.  Keeping your back upright, your shoulders over your hips, and your toes pointing forward, lift your right / left leg out to your side. Be sure to lift your leg with your hip muscles. Do not "throw" your leg or tip your body to lift your leg.  Slowly and with control, return to the starting position. Repeat exercise __________ times. Complete this exercise __________ times per day.  STRENGTH  Quadriceps, Squats  Stand in a door frame so that your feet and knees are in line with the frame.  Use your hands for balance, not support, on the frame.  Slowly lower your weight, bending at the hips and knees. Keep your lower legs upright so that they are parallel with the door frame. Squat only within the range that does not increase your knee pain. Never let your hips drop below your knees.  Slowly return upright, pushing with your legs, not pulling with your hands. Document Released: 01/03/2006 Document Revised: 03/09/2012 Document Reviewed: 03/30/2009 [facility] Patient Information 2014 [place], [place].

## 2013-07-25 ENCOUNTER — Telehealth: Payer: Self-pay | Admitting: Physician Assistant

## 2013-07-25 DIAGNOSIS — M25551 Pain in right hip: Secondary | ICD-10-CM

## 2013-07-25 NOTE — Telephone Encounter (Signed)
Call pt: I was looking back over labs and realized we had an xray of left hip on file but not of right. We do need to get an xray to evaluate burning and pain before proceed with more treatment. Amber put in xray for right hip and let pt know to go down to lab.

## 2013-07-26 ENCOUNTER — Ambulatory Visit (INDEPENDENT_AMBULATORY_CARE_PROVIDER_SITE_OTHER): Payer: Medicare Other

## 2013-07-26 ENCOUNTER — Encounter: Payer: Self-pay | Admitting: Sports Medicine

## 2013-07-26 ENCOUNTER — Ambulatory Visit (INDEPENDENT_AMBULATORY_CARE_PROVIDER_SITE_OTHER): Payer: Self-pay | Admitting: Sports Medicine

## 2013-07-26 VITALS — BP 167/105 | HR 78 | Wt 165.0 lb

## 2013-07-26 DIAGNOSIS — M25551 Pain in right hip: Secondary | ICD-10-CM

## 2013-07-26 DIAGNOSIS — M25559 Pain in unspecified hip: Secondary | ICD-10-CM

## 2013-07-26 MED ORDER — MELOXICAM 15 MG PO TABS: ORAL_TABLET | ORAL | Status: DC

## 2013-07-26 MED ORDER — AMITRIPTYLINE HCL 25 MG PO TABS: ORAL_TABLET | ORAL | Status: DC

## 2013-07-26 MED ORDER — PREDNISONE 10 MG PO TABS: ORAL_TABLET | ORAL | Status: DC

## 2013-07-26 NOTE — Progress Notes (Addendum)
  Subjective:    CC: Right hip burning  HPI: Kenneth Lin is a very pleasant 79 year old male, former [facility] [employer].  For several months he's had pain he localizes in the right lateral hip that he describes as a burning sensation. The sensation is fairly bad at night, and when sitting in a car for long periods of time.  It is not worse with Valsalva and he does not have any new bowel or bladder dysfunction. No constitutional symptoms. Pain is not reproduced with ambulation and weightbearing. He was given gabapentin sometime in the distant past which was highly effective for resolving the burning sensation in his lateral hip, but it caused him some altered mental status, and he refuses to try this again even a low dose. He was seen by Tandy Gaw, PA-C, x-rays were done that showed osteoarthritis and he was referred to me for further evaluation and definitive treatment and possible injection. Symptoms are moderate, persistent.  Past medical history, Surgical history, Family history not pertinant except as noted below, Social history, Allergies, and medications have been entered into the medical record, reviewed, and no changes needed.   Review of Systems: No fevers, chills, night sweats, weight loss, chest pain, or shortness of breath.   Objective:    General: Well Developed, well nourished, and in no acute distress.  Neuro: Alert and oriented x3, extra-ocular muscles intact, sensation grossly intact.  HEENT: Normocephalic, atraumatic, pupils equal round reactive to light, neck supple, no masses, no lymphadenopathy, thyroid nonpalpable.  Skin: Warm and dry, no rashes. Cardiac: Regular rate and rhythm, no murmurs rubs or gallops, no lower extremity edema.  Respiratory: Clear to auscultation bilaterally. Not using accessory muscles, speaking in full sentences. Left and right Hip: ROM IR: 45 Deg, ER: 45 Deg, Flexion: 120 Deg, Extension: 100 Deg, Abduction: 45 Deg, Adduction: 45  Deg Strength IR: 5/5, ER: 5/5, Flexion: 5/5, Extension: 5/5, Abduction: 5/5, Adduction: 5/5 Pelvic alignment unremarkable to inspection and palpation. Standing hip rotation and gait without trendelenburg sign / unsteadiness. Greater trochanter without tenderness to palpation. No tenderness over piriformis and greater trochanter. No pain with FABER or FADIR. No SI joint tenderness and normal minimal SI movement. Back Exam:  Inspection: Unremarkable  Motion: Flexion 45 deg, Extension 45 deg, Side Bending to 45 deg bilaterally,  Rotation to 45 deg bilaterally  SLR laying: Negative  XSLR laying: Negative  Palpable tenderness: None. FABER: negative. Sensory change: Gross sensation intact to all lumbar and sacral dermatomes.  Reflexes: 2+ at both patellar tendons, 2+ at achilles tendons, Babinski's downgoing.  Strength at foot  Plantar-flexion: 5/5 Dorsi-flexion: 5/5 Eversion: 5/5 Inversion: 5/5  Leg strength  Quad: 5/5 Hamstring: 5/5 Hip flexor: 5/5 Hip abductors: 5/5  Gait unremarkable.  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  Lumbar spine x-rays show bilateral L5-S1 pars defects with grade 1 anterolisthesis of L5 on S1.  Impression and Recommendations:

## 2013-07-26 NOTE — Assessment & Plan Note (Addendum)
Though this has been present for a long time, and she does have osteoarthritis on his hip x-rays, I do not think that his femoroacetabular joint is the pain generating structure. Especially considering his pain is in the lateral hip he describes it as a burning sensation, worse when sitting in the car for long periods of time , and better with gabapentin. He also has no pain with stressing of the femoroacetabular joint with forced hip internal rotation. Differential diagnosis does include meralgia paresthetica and right lumbar radiculitis. Considering his spondylolisthesis on x-rays I do think the low back is the most likely cause of his pain. Prednisone taper, Mobic, very low dose amitriptyline at bedtime considering his age, formal physical therapy, x-rays. Return in one month, MRI of the lumbar spine if no better.

## 2013-07-26 NOTE — Telephone Encounter (Signed)
Spoke with pt wife and she states he was on his way here to office for offive visit so can discuss with Jade at visit. Barry Dienes, LPN

## 2013-07-28 ENCOUNTER — Ambulatory Visit: Payer: Medicare Other | Admitting: Physician Assistant

## 2013-08-17 ENCOUNTER — Encounter: Payer: Self-pay | Admitting: *Deleted

## 2013-08-17 ENCOUNTER — Encounter: Payer: Self-pay | Admitting: Cardiology

## 2013-08-18 ENCOUNTER — Encounter: Payer: Self-pay | Admitting: Cardiology

## 2013-08-18 ENCOUNTER — Ambulatory Visit (INDEPENDENT_AMBULATORY_CARE_PROVIDER_SITE_OTHER): Payer: Self-pay | Admitting: Cardiology

## 2013-08-18 VITALS — BP 146/80 | HR 89 | Wt 163.0 lb

## 2013-08-18 DIAGNOSIS — I739 Peripheral vascular disease, unspecified: Secondary | ICD-10-CM

## 2013-08-18 DIAGNOSIS — I4891 Unspecified atrial fibrillation: Secondary | ICD-10-CM

## 2013-08-18 MED ORDER — APIXABAN 5 MG PO TABS: 5.0000 mg | ORAL_TABLET | Freq: Two times a day (BID) | ORAL | Status: DC

## 2013-08-18 NOTE — Assessment & Plan Note (Signed)
Patient is in atrial fibrillation today. Duration is unknown. I do not think he is symptomatic and I therefore do not think we need to pursue cardioversion. His rate is controlled today. I will schedule a 48 hour Holter monitor to make sure that his rate is adequately controlled with activities. Check echocardiogram. Obtain previous records from [facility]. He has embolic risk factors of hypertension and age greater than 2. Discontinue aspirin. Begin apixaban 5 mg by mouth twice a day. Check hgb and renal function in 4 weeks.

## 2013-08-18 NOTE — Progress Notes (Signed)
HPI: 79 year old male for evaluation of atrial fibrillation. Previously seen at [facility] Abdominal ultrasound in 2010 showed no aneurysm. Laboratories in July of 2014 showed a BUN and creatinine of 15 and 0.99. Liver functions normal. Platelet count 229. Patient states he was told approximately 4 years ago at [facility] and he had atrial fibrillation. He apparently had stress testing and an echo but those records are not available. He was recently again noted to have atrial fibrillation and we were asked to evaluate. He does have dyspnea on exertion which she attributes to COPD and is unchanged. No orthopnea, PND, pedal edema, palpitations, syncope, chest pain, history of bleeding or falls.  Current Outpatient Prescriptions  Medication Sig Dispense Refill  . albuterol (PROVENTIL HFA;VENTOLIN HFA) 108 (90 BASE) MCG/ACT inhaler Inhale 2 puffs into the lungs every 6 (six) hours as needed for wheezing.  1 Inhaler  6  . amitriptyline (ELAVIL) 25 MG tablet One half tab PO qHS for a week, then one tab PO qHS.  90 tablet  3  . aspirin EC 81 MG tablet Take 1 tablet (81 mg total) by mouth daily.  150 tablet  0  . budesonide-formoterol (SYMBICORT) 160-4.5 MCG/ACT inhaler Inhale 2 puffs into the lungs 2 (two) times daily.  1 Inhaler  3  . diazepam (VALIUM) 5 MG tablet Take 1 tablet (5 mg total) by mouth at bedtime as needed for anxiety.  30 tablet  2  . meloxicam (MOBIC) 15 MG tablet One tab PO qAM with breakfast for 2 weeks, then daily prn pain.  30 tablet  3  . predniSONE (DELTASONE) 10 MG tablet 4 tabs po daily x 6 days, then 3 tabs po daily x 4 days, then 2 tabs po daily x 4 days, then 1 tab po daily x 4 days  48 tablet  0  . tiotropium (SPIRIVA) 18 MCG inhalation capsule Place 1 capsule (18 mcg total) into inhaler and inhale daily.  30 capsule  5   No current facility-administered medications for this visit.    Allergies  Allergen Reactions  . Gabapentin     Dizziness, shaking, insomnia     Past Medical History  Diagnosis Date  . COPD (chronic obstructive pulmonary disease)   . Paroxysmal atrial fibrillation     Seeing [name] at [facility].    . Essential hypertension, benign   . Other and unspecified hyperlipidemia   . Peripheral artery disease     Left ABI of leg abnormal.      History reviewed. No pertinent past surgical history.  History   Social History  . Marital Status: Married    Spouse Name: N/A    Number of Children: 2  . 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 Smoking status: Former Games developer  . Smokeless tobacco: Not on file  . Alcohol Use: No  . Drug Use: No  . Sexual Activity: Not on file   Other Topics Concern  . Not on file   Social History Narrative  . No narrative on file    Family History  Problem Relation Age of Onset  . Heart failure Mother   . Cancer Father     ROS: no fevers or chills, productive cough, hemoptysis, dysphasia, odynophagia, melena, hematochezia, dysuria, hematuria, rash, seizure activity, orthopnea, PND, pedal edema, claudication. Remaining systems are negative.  Physical Exam:   Blood pressure 146/80, pulse 89, weight 163 lb (73.936 kg).  General:  Well developed/well nourished in NAD Skin warm/dry Patient not depressed No peripheral clubbing Back-normal HEENT-normal/normal eyelids Neck supple/normal carotid upstroke bilaterally; no bruits; no JVD; no thyromegaly chest - mildly diminished breath sounds CV - irregular/normal S1 and S2; no murmurs, rubs or gallops;  PMI nondisplaced Abdomen -NT/ND, no HSM, no mass, + bowel sounds, no bruit 2+ femoral pulses, no bruits Ext-no edema, chords, 2+ DP, diffuse ecchymosis Neuro-grossly nonfocal  ECG atrial fibrillation at a rate of 89. Nonspecific ST changes.

## 2013-08-18 NOTE — Assessment & Plan Note (Signed)
Blood pressure controlled. 

## 2013-08-18 NOTE — Patient Instructions (Addendum)
Your physician recommends that you schedule a follow-up appointment in: 3 MONTHS WITH DR Jens Som IN [place]  STOP ASPIRIN  Your physician has requested that you have an echocardiogram. Echocardiography is a painless test that uses sound waves to create images of your heart. It provides your doctor with information about the size and shape of your heart and how well your heart's chambers and valves are working. This procedure takes approximately one hour. There are no restrictions for this procedure.   Your physician has recommended that you wear a holter monitor. Holter monitors are medical devices that record the heart's electrical activity. Doctors most often use these monitors to diagnose arrhythmias. Arrhythmias are problems with the speed or rhythm of the heartbeat. The monitor is a small, portable device. You can wear one while you do your normal daily activities. This is usually used to diagnose what is causing palpitations/syncope (passing out).   START ELIQUIS 5 MG ONE TABLET TWICE DAILY  Your physician recommends that you return for lab work in: 4 WEEKS IN [place]

## 2013-08-20 ENCOUNTER — Telehealth: Payer: Self-pay | Admitting: Cardiology

## 2013-08-20 NOTE — Telephone Encounter (Signed)
Plan as outlined in initial ov. If he wants dccv, would need ov to discuss Olga Millers

## 2013-08-20 NOTE — Telephone Encounter (Signed)
New Prob  Pt would like to speak with a nurse regarding some of the things Dr Jens Som discussed with him. He said he is a little confused and he just wants some clarification.

## 2013-08-20 NOTE — Telephone Encounter (Signed)
Spoke with patient who has questions about atrial fib as he has been reading about it since he saw Dr. Jens Som on 8/20.  Patient states the material he has been reading suggests he should undergo cardioversion and take blood thinner. Patient is wondering if SOB he thought was caused by COPD may actually be caused by a fib and if cardioversion would improve his breathing problems.  Patient has multiple questions about cardioversion, what ECHO will show, Eliquis vs. Coumadin, etc.   I answered patient's questions to the best of my ability spending 30 min on the phone with the patient and advised that I would send message to Dr. Jens Som for further advice.  Patient verbalized understanding and agreement with plan of care.

## 2013-08-25 ENCOUNTER — Encounter (INDEPENDENT_AMBULATORY_CARE_PROVIDER_SITE_OTHER): Payer: Medicare Other

## 2013-08-25 ENCOUNTER — Encounter: Payer: Self-pay | Admitting: *Deleted

## 2013-08-25 DIAGNOSIS — I739 Peripheral vascular disease, unspecified: Secondary | ICD-10-CM

## 2013-08-25 DIAGNOSIS — I4891 Unspecified atrial fibrillation: Secondary | ICD-10-CM

## 2013-08-25 NOTE — Telephone Encounter (Signed)
Spoke with pt, Follow up scheduled  

## 2013-08-25 NOTE — Progress Notes (Signed)
Patient ID: Kenneth Lin, male   DOB: 1930/02/24, 79 y.o.   MRN: [id number] E-Cardio 48 Hour Holter Monitor applied to patient.

## 2013-08-26 ENCOUNTER — Telehealth: Payer: Self-pay | Admitting: Cardiology

## 2013-08-26 NOTE — Telephone Encounter (Signed)
Spoke with pt, he reports since starting the eliquis his thumb on the left hand is swollen to the point he can not button his shirt. It is painful to the touch. Also on his right forearm he has an area that is sore to the touch and swollen. According to the pt the package insert it reports joint pain and swelling as a side effect. Explained to pt not sure related but will discuss with the pharm md.

## 2013-08-26 NOTE — Telephone Encounter (Signed)
Follow Up    Pt is calling regarding his previous message about his reaction to [facility]. Please call.

## 2013-08-26 NOTE — Telephone Encounter (Signed)
New Problem  Medication Eliquis has a side effect that he is exhibiting// unexpected pain, swelling and joint pain// Thumb and wrist on right hand.

## 2013-08-26 NOTE — Telephone Encounter (Signed)
Spoke with sally putt pharm md, explained to pt that not a usual side effect but there has been one other pt with a simular problem. The pt is coming to the office tomorrow to return his monitor and we will look at the finger when he is here.

## 2013-08-27 ENCOUNTER — Ambulatory Visit (HOSPITAL_COMMUNITY): Payer: Medicare Other | Attending: Cardiology | Admitting: Radiology

## 2013-08-27 ENCOUNTER — Telehealth: Payer: Self-pay

## 2013-08-27 DIAGNOSIS — I079 Rheumatic tricuspid valve disease, unspecified: Secondary | ICD-10-CM | POA: Insufficient documentation

## 2013-08-27 DIAGNOSIS — I4891 Unspecified atrial fibrillation: Secondary | ICD-10-CM

## 2013-08-27 DIAGNOSIS — I059 Rheumatic mitral valve disease, unspecified: Secondary | ICD-10-CM | POA: Insufficient documentation

## 2013-08-27 DIAGNOSIS — I10 Essential (primary) hypertension: Secondary | ICD-10-CM | POA: Insufficient documentation

## 2013-08-27 DIAGNOSIS — I739 Peripheral vascular disease, unspecified: Secondary | ICD-10-CM | POA: Insufficient documentation

## 2013-08-27 DIAGNOSIS — Z87891 Personal history of nicotine dependence: Secondary | ICD-10-CM | POA: Insufficient documentation

## 2013-08-27 NOTE — Telephone Encounter (Signed)
Patient came in to have his 48 hour holter monitor removed. Done GL

## 2013-08-27 NOTE — Telephone Encounter (Signed)
Patient presented today for echocardiogram. Since starting eloquis he has developed erythema, swelling and pain in his left thumb and also a small patch of urticaria over his right forearm. He had 2+ radial pulses on examination. There was edema, warmth and erythema in the left thumb. There is a small 4 x 4 centimeter area of erythema and warmth over his right forearm. Not clear to me that this is related to medication but given the temporal relationship we will discontinue eloquis and resume aspirin. If his symptoms improve we will consider an alternative anticoagulant agent. He will see me back in 2 weeks. If the above does not improve he will need to see primary care for workup of other causes. He has no other systemic complaints including fevers or arthralgias. Olga Millers

## 2013-08-27 NOTE — Telephone Encounter (Signed)
Pt has a follow up 09-08-13

## 2013-08-27 NOTE — Progress Notes (Signed)
Echocardiogram performed.  

## 2013-08-30 ENCOUNTER — Telehealth: Payer: Self-pay | Admitting: Nurse Practitioner

## 2013-08-30 NOTE — Telephone Encounter (Signed)
Pt called this evening stating that the swelling on his left thumb has worsened some and is now associated with swelling of his left wrist and the back of his left hand.  He is sure that this is all related to eliquis, which he has been off of since Friday.  He has no pain or loss of sensation related to this.  I recommended that if he would like to be seen tonight, then he should come into the ED.  Otherwise, I recommended that he consider using ice, 20 mins on/off for tonight and f/u with either [place] or primary care in the AM for assessment of the swelling.  After ~ 20 mins on the phone, the pt agreed with this plan.  He thinks he may come into the ED tonight for evaluation.

## 2013-08-31 ENCOUNTER — Telehealth: Payer: Self-pay | Admitting: Cardiology

## 2013-08-31 ENCOUNTER — Telehealth: Payer: Self-pay | Admitting: Physician Assistant

## 2013-08-31 ENCOUNTER — Other Ambulatory Visit: Payer: Self-pay | Admitting: *Deleted

## 2013-08-31 DIAGNOSIS — I4891 Unspecified atrial fibrillation: Secondary | ICD-10-CM

## 2013-08-31 NOTE — Telephone Encounter (Signed)
Awaiting monitor results to review and call pt.

## 2013-08-31 NOTE — Telephone Encounter (Signed)
Patient wants to switch to dr hommel. Nothing personal, his son sees dr [name] and he wanted to switch.  Is it ok to switch

## 2013-08-31 NOTE — Telephone Encounter (Signed)
Pt said he was to receive a call from debra mathis today and has to leave to go et lab work, can reach him on his cell [phone]

## 2013-09-01 ENCOUNTER — Telehealth: Payer: Self-pay | Admitting: Cardiology

## 2013-09-01 LAB — CBC WITH DIFFERENTIAL/PLATELET
Basophils Absolute: 0 10*3/uL (ref 0.0–0.1)
Basophils Relative: 0 % (ref 0–1)
Eosinophils Relative: 0 % (ref 0–5)
HCT: 40.5 % (ref 39.0–52.0)
Hemoglobin: 13.7 g/dL (ref 13.0–17.0)
MCHC: 33.8 g/dL (ref 30.0–36.0)
MCV: 85.3 fL (ref 78.0–100.0)
Monocytes Absolute: 0.5 10*3/uL (ref 0.1–1.0)
Monocytes Relative: 4 % (ref 3–12)
Neutro Abs: 9.8 10*3/uL — ABNORMAL HIGH (ref 1.7–7.7)
RDW: 16.3 % — ABNORMAL HIGH (ref 11.5–15.5)

## 2013-09-01 NOTE — Telephone Encounter (Signed)
Ok with me, I'd request a 30 minute visit at his first visit with me.

## 2013-09-01 NOTE — Telephone Encounter (Signed)
Spoke with pt, aware monitor results not available yet. Aware of lab results and results forwarded to PCP.

## 2013-09-01 NOTE — Telephone Encounter (Signed)
F/up  Pt requesting results.

## 2013-09-01 NOTE — Telephone Encounter (Signed)
New problem   Pt need nurse to call him concerning holter and echocardiogram.

## 2013-09-01 NOTE — Telephone Encounter (Signed)
Ok with me 

## 2013-09-02 ENCOUNTER — Encounter: Payer: Self-pay | Admitting: Sports Medicine

## 2013-09-02 ENCOUNTER — Ambulatory Visit (INDEPENDENT_AMBULATORY_CARE_PROVIDER_SITE_OTHER): Payer: Medicare Other | Admitting: Sports Medicine

## 2013-09-02 ENCOUNTER — Telehealth: Payer: Self-pay | Admitting: *Deleted

## 2013-09-02 VITALS — BP 179/110 | HR 88 | Wt 158.0 lb

## 2013-09-02 DIAGNOSIS — M25551 Pain in right hip: Secondary | ICD-10-CM

## 2013-09-02 DIAGNOSIS — L03114 Cellulitis of left upper limb: Secondary | ICD-10-CM | POA: Insufficient documentation

## 2013-09-02 DIAGNOSIS — L02519 Cutaneous abscess of unspecified hand: Secondary | ICD-10-CM

## 2013-09-02 MED ORDER — SULFAMETHOXAZOLE-TRIMETHOPRIM 800-160 MG PO TABS: 1.0000 | ORAL_TABLET | Freq: Two times a day (BID) | ORAL | Status: AC

## 2013-09-02 MED ORDER — DILTIAZEM HCL ER COATED BEADS 120 MG PO CP24: 120.0000 mg | ORAL_CAPSULE | Freq: Every day | ORAL | Status: DC

## 2013-09-02 MED ORDER — TRAMADOL HCL 50 MG PO TABS: 50.0000 mg | ORAL_TABLET | Freq: Three times a day (TID) | ORAL | Status: DC | PRN

## 2013-09-02 NOTE — Progress Notes (Signed)
  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 He has redness and swelling as well as pain localized over the left knuckles. Symptoms are moderate, persistent, worsening.  Right hip pain: X-rays showed lumbar spondylolisthesis and symptoms likely represented radiculitis. This resolved with conservative measures.  Past medical history, Surgical history, Family history not pertinant except as noted below, Social history, Allergies, and medications have been entered into the medical record, reviewed, and no changes needed.   Review of Systems: No fevers, chills, night sweats, weight loss, chest pain, or shortness of breath.   Objective:    General: Well Developed, well nourished, and in no acute distress.  Neuro: Alert and oriented x3, extra-ocular muscles intact, sensation grossly intact.  HEENT: Normocephalic, atraumatic, pupils equal round reactive to light, neck supple, no masses, no lymphadenopathy, thyroid nonpalpable.  Skin: Warm and dry, no rashes. Cardiac: Regular rate and rhythm, no murmurs rubs or gallops, no lower extremity edema.  Respiratory: Clear to auscultation bilaterally. Not using accessory muscles, speaking in full sentences. Left hand: There is erythema, induration, warmth, over the second through fourth knuckles. He has good passive range of motion of his fingers without pain.  Impression and Recommendations:

## 2013-09-02 NOTE — Addendum Note (Signed)
Addended by: Monica Becton on: 09/02/2013 04:56 PM   Modules accepted: Orders

## 2013-09-02 NOTE — Assessment & Plan Note (Addendum)
This is failed cephalexin, this likely represents community-acquired MRSA, and adding Septra for 10 days. Like to see him back in 10 days to recheck the infection.  Insufficient pain control with NSAIDs, adding tramadol.

## 2013-09-02 NOTE — Assessment & Plan Note (Signed)
This likely represented lumbar radiculitis from grade 1 spondylolisthesis. This resolved with conservative measures.

## 2013-09-02 NOTE — Telephone Encounter (Signed)
Spoke with pt, aware monitor reviewed by dr Jens Som shows atrial fib; rate increased, will add cardizem cd 120 mg once daily.

## 2013-09-06 ENCOUNTER — Other Ambulatory Visit: Payer: Self-pay | Admitting: Physician Assistant

## 2013-09-06 NOTE — Telephone Encounter (Signed)
Lesly Rubenstein can you look at this it seems early to me

## 2013-09-07 ENCOUNTER — Encounter: Payer: Self-pay | Admitting: Sports Medicine

## 2013-09-07 ENCOUNTER — Telehealth: Payer: Self-pay | Admitting: *Deleted

## 2013-09-07 ENCOUNTER — Ambulatory Visit (INDEPENDENT_AMBULATORY_CARE_PROVIDER_SITE_OTHER): Payer: Medicare Other | Admitting: Sports Medicine

## 2013-09-07 VITALS — BP 146/86 | HR 88 | Wt 159.0 lb

## 2013-09-07 DIAGNOSIS — L03114 Cellulitis of left upper limb: Secondary | ICD-10-CM

## 2013-09-07 DIAGNOSIS — L989 Disorder of the skin and subcutaneous tissue, unspecified: Secondary | ICD-10-CM

## 2013-09-07 DIAGNOSIS — F411 Generalized anxiety disorder: Secondary | ICD-10-CM

## 2013-09-07 DIAGNOSIS — L02519 Cutaneous abscess of unspecified hand: Secondary | ICD-10-CM

## 2013-09-07 MED ORDER — DIAZEPAM 5 MG PO TABS: 5.0000 mg | ORAL_TABLET | Freq: Two times a day (BID) | ORAL | Status: DC | PRN

## 2013-09-07 NOTE — Assessment & Plan Note (Signed)
Uses Valium daily, I've asked him to followup with his primary care provider regarding this. Because of the cellulitis he's been using 2-3 per day, and needs an early refill. I will provide this x1, and he can followup with his PCP.

## 2013-09-07 NOTE — Telephone Encounter (Signed)
I would need to see him first before Rxing a controlled substance, [place] controlled substance database shows it was last received by him on 08/21/13, I'll forward this to [place] as well since she last prescribed it.

## 2013-09-07 NOTE — Assessment & Plan Note (Signed)
He can schedule an excision, I would wait at least one month after the cellulitis resolves.

## 2013-09-07 NOTE — Telephone Encounter (Signed)
Pt is requesting refill on Diazepam but it hasn't been filled since 04/2013. I was informed pt has switched you to his PCP. Please advise if we can fill or if you need to see him.  Meyer Cory, LPN

## 2013-09-07 NOTE — Assessment & Plan Note (Signed)
This is resolving with Septra. The patient begged me to recheck his white blood cell count so we will do this. He can return to see me on an as needed basis.

## 2013-09-07 NOTE — Progress Notes (Signed)
  Subjective:    CC: Followup  HPI: Cellulitis: Has resolved now that he has been on Septra, swelling, pain improved. He does desire that I recheck his white blood cell count which was elevated when checked by another physician at the beginning of his cellulitis.  Anxiety: He has been quite anxious and using 2-3 Valium per day during this experience. He asks that I refill his Valium.  Left arm lesion: For years it has been present, rounded, not painful, desires that this is removed.  Past medical history, Surgical history, Family history not pertinant except as noted below, Social history, Allergies, and medications have been entered into the medical record, reviewed, and no changes needed.   Review of Systems: No fevers, chills, night sweats, weight loss, chest pain, or shortness of breath.   Objective:    General: Well Developed, well nourished, and in no acute distress.  Neuro: Alert and oriented x3, extra-ocular muscles intact, sensation grossly intact.  HEENT: Normocephalic, atraumatic, pupils equal round reactive to light, neck supple, no masses, no lymphadenopathy, thyroid nonpalpable.  Skin: Warm and dry, no rashes. There is a 2 cm rounded nodule on his left upper arm. This is fluctuant, nonerythematous, nontender. Previous cellulitis of his left hand has resolved. He does have bilateral Dupuytren's contractures. Cardiac: Regular rate and rhythm, no murmurs rubs or gallops, no lower extremity edema.  Respiratory: Clear to auscultation bilaterally. Not using accessory muscles, speaking in full sentences.  Impression and Recommendations:

## 2013-09-08 ENCOUNTER — Ambulatory Visit: Payer: Self-pay | Admitting: Cardiology

## 2013-09-08 ENCOUNTER — Encounter: Payer: Self-pay | Admitting: Cardiology

## 2013-09-08 ENCOUNTER — Ambulatory Visit (INDEPENDENT_AMBULATORY_CARE_PROVIDER_SITE_OTHER): Payer: Medicare Other | Admitting: Cardiology

## 2013-09-08 VITALS — BP 130/80 | HR 86 | Ht 70.0 in | Wt 159.0 lb

## 2013-09-08 DIAGNOSIS — I4891 Unspecified atrial fibrillation: Secondary | ICD-10-CM

## 2013-09-08 DIAGNOSIS — I480 Paroxysmal atrial fibrillation: Secondary | ICD-10-CM

## 2013-09-08 LAB — CBC WITH DIFFERENTIAL/PLATELET
Basophils Absolute: 0.1 K/uL (ref 0.0–0.1)
Basophils Relative: 1 % (ref 0–1)
Eosinophils Absolute: 0.1 10*3/uL (ref 0.0–0.7)
Eosinophils Relative: 2 % (ref 0–5)
HCT: 38.4 % — ABNORMAL LOW (ref 39.0–52.0)
Hemoglobin: 12.5 g/dL — ABNORMAL LOW (ref 13.0–17.0)
Lymphocytes Relative: 9 % — ABNORMAL LOW (ref 12–46)
Lymphs Abs: 0.7 10*3/uL (ref 0.7–4.0)
MCH: 28.2 pg (ref 26.0–34.0)
MCHC: 32.6 g/dL (ref 30.0–36.0)
MCV: 86.7 fL (ref 78.0–100.0)
Monocytes Absolute: 0.6 K/uL (ref 0.1–1.0)
Monocytes Relative: 8 % (ref 3–12)
Neutro Abs: 6 K/uL (ref 1.7–7.7)
Neutrophils Relative %: 80 % — ABNORMAL HIGH (ref 43–77)
Platelets: 280 10*3/uL (ref 150–400)
RBC: 4.43 MIL/uL (ref 4.22–5.81)
RDW: 16.1 % — ABNORMAL HIGH (ref 11.5–15.5)
WBC: 7.5 K/uL (ref 4.0–10.5)

## 2013-09-08 MED ORDER — ASPIRIN 325 MG PO TABS: 325.0000 mg | ORAL_TABLET | Freq: Every day | ORAL | Status: DC

## 2013-09-08 NOTE — Telephone Encounter (Signed)
Appears he is not up for refill until end of this month. Dr. Karie Schwalbe authorized last refill I have not seen in a few months. Office visit is reasonable.

## 2013-09-08 NOTE — Assessment & Plan Note (Signed)
Blood pressure controlled. Continue present medications. 

## 2013-09-08 NOTE — Patient Instructions (Addendum)
Your physician has recommended you make the following change in your medication:   1.  Start Aspirin 325 mg Daily.  Your physician recommends that you schedule a follow-up appointment in: 3 months with Dr. Jens Som.

## 2013-09-08 NOTE — Assessment & Plan Note (Addendum)
Patient remains in atrial fibrillation today. Plan rate control and anticoagulation as he is not symptomatic. He has followed his heart rate at home. It appears to be controlled on his present dose of Cardizem. His LV function is normal. We had a long discussion today concerning risk of embolic event. Embolic risk factors of hypertension and age greater than 48. We discussed aspirin versus Coumadin or one of the new oral anticoagulant agents. I explained that I was not convinced that his recent hand issue was related to a medication. It has improved with antibiotics and may have been cellulitis. I recommended one of the new agents. However he declined and prefers aspirin at this time. He understands the higher risk of embolic event including CVA with aspirin compared to a new oral anticoagulant agent. We will begin 325 mg daily. He will contact us if he changes his mind.

## 2013-09-08 NOTE — Progress Notes (Signed)
HPI: FU atrial fibrillation. Previously seen at [facility]. Abdominal ultrasound in 2010 showed no aneurysm. Patient states he was told approximately 4 years ago at [facility] and he had atrial fibrillation. He apparently had stress testing and an echo but those records are not available. I initially saw him in August of 2014. Echocardiogram in August of 2014 showed normal LV function and mild mitral regurgitation. Holter monitor in August of 2014 showed atrial fibrillation with an elevated rate and Cardizem was added. When I saw him previously we discontinued his aspirin and begin Apixaban. Several days later the patient developed pain and swelling in his left from and also a small patch of what appeared to be urticaria over his right forearm. He felt this was related to apixaban and this medication was discontinued. He was treated for cellulitis and this has now improved. He now describes mild dyspnea on exertion which is unchanged. No orthopnea, PND, pedal edema, syncope, chest pain or palpitations.   Current Outpatient Prescriptions  Medication Sig Dispense Refill  . albuterol (PROVENTIL HFA;VENTOLIN HFA) 108 (90 BASE) MCG/ACT inhaler Inhale 2 puffs into the lungs every 6 (six) hours as needed for wheezing.  1 Inhaler  6  . budesonide-formoterol (SYMBICORT) 160-4.5 MCG/ACT inhaler Inhale 2 puffs into the lungs 2 (two) times daily.  1 Inhaler  3  . diazepam (VALIUM) 5 MG tablet Take 1 tablet (5 mg total) by mouth at bedtime as needed for anxiety.  30 tablet  2  . diltiazem (CARDIZEM CD) 120 MG 24 hr capsule Take 1 capsule (120 mg total) by mouth daily.  30 capsule  12  . sulfamethoxazole-trimethoprim (BACTRIM DS,SEPTRA DS) 800-160 MG per tablet Take 1 tablet by mouth 2 (two) times daily.  20 tablet  0  . tiotropium (SPIRIVA) 18 MCG inhalation capsule Place 1 capsule (18 mcg total) into inhaler and inhale daily.  30 capsule  5  . traMADol (ULTRAM) 50 MG tablet Take 1 tablet (50 mg total) by mouth  every 8 (eight) hours as needed for pain.  50 tablet  2   No current facility-administered medications for this visit.     Past Medical History  Diagnosis Date  . COPD (chronic obstructive pulmonary disease)   . Paroxysmal atrial fibrillation     Seeing [name] at [facility].    . Essential hypertension, benign   . Other and unspecified hyperlipidemia   . Peripheral artery disease     Left ABI of leg abnormal.      History reviewed. No pertinent past surgical history.  History   Social History  . Marital Status: Married    Spouse Name: N/A    Number of Children: 2  . 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 Smoking status: Former Games developer  . Smokeless tobacco: Not on file  . Alcohol Use: No  . Drug Use: No  . Sexual Activity: Not on file   Other Topics Concern  . Not on file   Social History Narrative  . No narrative on file    ROS: no fevers or chills, productive cough, hemoptysis, dysphasia, odynophagia, melena, hematochezia, dysuria, hematuria, rash, seizure activity, orthopnea, PND, pedal edema, claudication. Remaining systems are negative.  Physical Exam: Well-developed well-nourished in no acute distress.  Skin is warm and dry.  HEENT is normal.  Neck is supple.  Chest is clear to auscultation with normal expansion.  Cardiovascular exam is  irregular Abdominal exam nontender or distended. No masses palpated. Extremities show no edema. There is mild desquamation of left thumb. Previous edema and erythema has much improved. neuro grossly intact

## 2013-09-09 ENCOUNTER — Encounter: Payer: Self-pay | Admitting: Family Medicine

## 2013-09-10 ENCOUNTER — Encounter: Payer: Self-pay | Admitting: Sports Medicine

## 2013-09-10 ENCOUNTER — Ambulatory Visit (INDEPENDENT_AMBULATORY_CARE_PROVIDER_SITE_OTHER): Payer: Medicare Other | Admitting: Sports Medicine

## 2013-09-10 ENCOUNTER — Telehealth: Payer: Self-pay | Admitting: Sports Medicine

## 2013-09-10 VITALS — BP 157/76 | HR 86 | Wt 148.0 lb

## 2013-09-10 DIAGNOSIS — L03114 Cellulitis of left upper limb: Secondary | ICD-10-CM

## 2013-09-10 DIAGNOSIS — L02519 Cutaneous abscess of unspecified hand: Secondary | ICD-10-CM

## 2013-09-10 NOTE — Assessment & Plan Note (Signed)
Resolve, return as needed. 

## 2013-09-10 NOTE — Progress Notes (Signed)
  Subjective:    CC: Followup  HPI: Kenneth Lin comes in with complaints of worsening cellulitis and skin peeling after finishing a course of Septra for left hand cellulitis. He has no pain, no swelling, no redness, his only complaint is peeling skin. Symptoms are mild, improving.  Past medical history, Surgical history, Family history not pertinant except as noted below, Social history, Allergies, and medications have been entered into the medical record, reviewed, and no changes needed.   Review of Systems: No fevers, chills, night sweats, weight loss, chest pain, or shortness of breath.   Objective:    General: Well Developed, well nourished, and in no acute distress.  Neuro: Alert and oriented x3, extra-ocular muscles intact, sensation grossly intact.  HEENT: Normocephalic, atraumatic, pupils equal round reactive to light, neck supple, no masses, no lymphadenopathy, thyroid nonpalpable.  Skin: Warm and dry, no rashes. There is mild peeling of the skin over the phone, cellulitis has resolved, there is no erythema, induration, pain. Cardiac: Regular rate and rhythm, no murmurs rubs or gallops, no lower extremity edema.  Respiratory: Clear to auscultation bilaterally. Not using accessory muscles, speaking in full sentences.  Recent CBC showed normalization of the white blood cell count.  Impression and Recommendations:

## 2013-09-10 NOTE — Telephone Encounter (Signed)
Called patient back. He is going to come on in now. Tag Wurtz,CMA

## 2013-09-10 NOTE — Telephone Encounter (Signed)
Patient called this morning I tranfsered call to [facility] but he scheduled an appointment for today but wanted to just see if a question could be answered instead of him coming in for an appointment. Patient request to get a call back to see if he needs to keep his appointment for today or not. Thanks

## 2013-09-13 ENCOUNTER — Ambulatory Visit: Payer: Medicare Other | Admitting: Physician Assistant

## 2013-09-13 ENCOUNTER — Encounter: Payer: Self-pay | Admitting: Sports Medicine

## 2013-09-13 ENCOUNTER — Ambulatory Visit (INDEPENDENT_AMBULATORY_CARE_PROVIDER_SITE_OTHER): Payer: Medicare Other | Admitting: Sports Medicine

## 2013-09-13 VITALS — BP 164/92 | HR 83 | Wt 157.0 lb

## 2013-09-13 DIAGNOSIS — L03114 Cellulitis of left upper limb: Secondary | ICD-10-CM

## 2013-09-13 DIAGNOSIS — L989 Disorder of the skin and subcutaneous tissue, unspecified: Secondary | ICD-10-CM

## 2013-09-13 DIAGNOSIS — L02519 Cutaneous abscess of unspecified hand: Secondary | ICD-10-CM

## 2013-09-13 NOTE — Assessment & Plan Note (Signed)
I debrided some peeling skin, I explained in depth that the peeling skin was common after a cellulitis. All erythema has resolved. He had several questions about his CBC, I explained everything in detail. He still does not believe that the bacteria are eradicated, I have offered to check a blood culture, I will also add a CBC. Since he was slightly anemic, normocytic, he will follow this up with his PCP.

## 2013-09-13 NOTE — Assessment & Plan Note (Signed)
He still has what appears to be a cyst on his left arm. He can return to either me or his PCP for excision with primary closure.

## 2013-09-13 NOTE — Progress Notes (Signed)
  Subjective:    CC: Follow up  HPI: Hand cellulitis: I've not seen Kenneth Lin several times for this, he continued to come back perhaps at his followup for much later. We treated him fully with Septra and his cellulitis resolved, he had been having some skin peeling and is extremely worried about this. There is no increase in pain, he is mostly worried about transferring MRSA to his family. We obtained a repeat CBC which showed normalization of his white blood cell count.  Past medical history, Surgical history, Family history not pertinant except as noted below, Social history, Allergies, and medications have been entered into the medical record, reviewed, and no changes needed.   Review of Systems: No fevers, chills, night sweats, weight loss, chest pain, or shortness of breath.   Objective:    General: Well Developed, well nourished, and in no acute distress.  Neuro: Alert and oriented x3, extra-ocular muscles intact, sensation grossly intact.  HEENT: Normocephalic, atraumatic, pupils equal round reactive to light, neck supple, no masses, no lymphadenopathy, thyroid nonpalpable.  Skin: Warm and dry, no rashes. There is some skin peeling on the left hand, dead skin was debrided. Cardiac: Regular rate and rhythm, no murmurs rubs or gallops, no lower extremity edema.  Respiratory: Clear to auscultation bilaterally. Not using accessory muscles, speaking in full sentences. Impression and Recommendations:

## 2013-09-15 LAB — CBC WITH DIFFERENTIAL/PLATELET
Basophils Absolute: 0 K/uL (ref 0.0–0.1)
Basophils Relative: 0 % (ref 0–1)
Eosinophils Absolute: 0.2 10*3/uL (ref 0.0–0.7)
Eosinophils Relative: 2 % (ref 0–5)
HCT: 38.8 % — ABNORMAL LOW (ref 39.0–52.0)
Hemoglobin: 13 g/dL (ref 13.0–17.0)
Lymphocytes Relative: 10 % — ABNORMAL LOW (ref 12–46)
Lymphs Abs: 0.8 K/uL (ref 0.7–4.0)
MCH: 29.1 pg (ref 26.0–34.0)
MCHC: 33.5 g/dL (ref 30.0–36.0)
MCV: 87 fL (ref 78.0–100.0)
Monocytes Absolute: 0.5 10*3/uL (ref 0.1–1.0)
Monocytes Relative: 6 % (ref 3–12)
Neutro Abs: 6.5 K/uL (ref 1.7–7.7)
Neutrophils Relative %: 82 % — ABNORMAL HIGH (ref 43–77)
Platelets: 238 K/uL (ref 150–400)
RBC: 4.46 MIL/uL (ref 4.22–5.81)
RDW: 16.7 % — ABNORMAL HIGH (ref 11.5–15.5)
WBC: 7.9 K/uL (ref 4.0–10.5)

## 2013-09-21 LAB — CULTURE, BLOOD (SINGLE): Organism ID, Bacteria: NO GROWTH

## 2013-09-28 ENCOUNTER — Encounter: Payer: Self-pay | Admitting: Family Medicine

## 2013-09-28 ENCOUNTER — Ambulatory Visit (INDEPENDENT_AMBULATORY_CARE_PROVIDER_SITE_OTHER): Payer: Medicare Other | Admitting: Family Medicine

## 2013-09-28 VITALS — BP 158/95 | HR 94 | Wt 160.0 lb

## 2013-09-28 DIAGNOSIS — I4891 Unspecified atrial fibrillation: Secondary | ICD-10-CM

## 2013-09-28 DIAGNOSIS — R609 Edema, unspecified: Secondary | ICD-10-CM

## 2013-09-28 MED ORDER — DILTIAZEM HCL ER COATED BEADS 180 MG PO CP24: 180.0000 mg | ORAL_CAPSULE | Freq: Every day | ORAL | Status: DC

## 2013-09-28 NOTE — Progress Notes (Signed)
CC: Kenneth Lin is a 79 y.o. male is here for Foot Swelling   Subjective: HPI:  Patient complains of a bilateral ankle swelling has been present for the past 2-3 years it comes and goes on a monthly basis. Current episode has only been bothering him for 2 days improves with rest and worsened with standing for long periods of time. It is symmetric described as mild in severity it is painless localized only to the feet and ankles. No interventions as of yet with this episode. He reports treating more beer than what he usually does over the weekend but no other change in diet he is unable to identify any increase in salt in his diet. He has been taking his pulse at home and reports recently it's been about 90 at rest uncertain with exertion. No outside blood pressures to report. He denies fevers, chills, shortness of breath, chest discomfort, orthopnea, nor edema elsewhere than that listed above   Review Of Systems Outlined In HPI  Past Medical History  Diagnosis Date  . COPD (chronic obstructive pulmonary disease)   . Paroxysmal atrial fibrillation     Seeing [name] at [facility].    . Essential hypertension, benign   . Other and unspecified hyperlipidemia   . Peripheral artery disease     Left ABI of leg abnormal.       Family History  Problem Relation Age of Onset  . Heart failure Mother   . Cancer Father      History  Substance Use Topics  . Smoking status: Former Games developer  . Smokeless tobacco: Not on file  . Alcohol Use: No     Objective: Filed Vitals:   09/28/13 1123  BP: 158/95  Pulse: 94    General: Alert and Oriented, No Acute Distress HEENT: Pupils equal, round, reactive to light. Conjunctivae clear.  Moist mucous membranes pharynx unremarkable Lungs: Clear to auscultation bilaterally, no wheezing/ronchi/rales.  Comfortable work of breathing. Good air movement. Cardiac: Irregularly irregular rhythm. Normal S1/S2.  No murmurs, rubs, nor gallops.   Abdomen: Soft  nontender Extremities: Trace to 1+ pitting edema of the ankles and feet symmetric.  Strong peripheral pulses.  Mental Status: No depression, anxiety, nor agitation. Skin: Warm and dry.  Assessment & Plan: Kenneth Lin was seen today for foot swelling.  Diagnoses and associated orders for this visit:  Atrial fibrillation - diltiazem (CARDIZEM CD) 180 MG 24 hr capsule; Take 1 capsule (180 mg total) by mouth daily.  Edema    Discussed with patient my suspicion of uncontrolled rate with atrial fibrillation contributing at least some if not all to his swelling. We will increase diltiazem I've encouraged him to take his blood pressure and pulse at home for the next week and drop off values next week. Low suspicion for heart failure versus renal failure, discussed minimizing salt intake.  25 minutes spent face-to-face during visit today of which at least 50% was counseling or coordinating care regarding atrial fibrillation, edema.     Return in about 1 week (around 10/05/2013).

## 2013-09-29 ENCOUNTER — Ambulatory Visit: Payer: Medicare Other | Admitting: Family Medicine

## 2013-10-09 ENCOUNTER — Other Ambulatory Visit: Payer: Self-pay | Admitting: Sports Medicine

## 2013-10-11 NOTE — Telephone Encounter (Signed)
[name], Rx placed in in-box ready for pickup/faxing.  

## 2013-10-13 ENCOUNTER — Ambulatory Visit: Payer: Medicare Other | Admitting: Cardiology

## 2013-10-15 ENCOUNTER — Ambulatory Visit (INDEPENDENT_AMBULATORY_CARE_PROVIDER_SITE_OTHER): Payer: Medicare Other | Admitting: Physician Assistant

## 2013-10-15 ENCOUNTER — Encounter: Payer: Self-pay | Admitting: Physician Assistant

## 2013-10-15 VITALS — BP 143/80 | HR 91 | Wt 160.0 lb

## 2013-10-15 DIAGNOSIS — R600 Localized edema: Secondary | ICD-10-CM

## 2013-10-15 DIAGNOSIS — H6980 Other specified disorders of Eustachian tube, unspecified ear: Secondary | ICD-10-CM

## 2013-10-15 DIAGNOSIS — H6982 Other specified disorders of Eustachian tube, left ear: Secondary | ICD-10-CM

## 2013-10-15 DIAGNOSIS — R609 Edema, unspecified: Secondary | ICD-10-CM

## 2013-10-15 DIAGNOSIS — H6992 Unspecified Eustachian tube disorder, left ear: Secondary | ICD-10-CM

## 2013-10-15 MED ORDER — FLUTICASONE PROPIONATE 50 MCG/ACT NA SUSP: 2.0000 | Freq: Every day | NASAL | Status: DC

## 2013-10-15 MED ORDER — HYDROCORTISONE-ACETIC ACID 1-2 % OT SOLN: 4.0000 [drp] | Freq: Three times a day (TID) | OTIC | Status: DC

## 2013-10-15 NOTE — Patient Instructions (Signed)
Barotitis Media Barotitis media is soreness (inflammation) of the area behind the eardrum (middle ear). This occurs when the auditory tube (Eustachian tube) leading from the back of the throat to the eardrum is blocked. When it is blocked air cannot move in and out of the middle ear to equalize pressure changes. These pressure changes come from changes in altitude when:  Flying.  Driving in the mountains.  Diving. Problems are more likely to occur with pressure changes during times when you are congested as from:  Hay fever.  Upper respiratory infection.  A cold. Damage or hearing loss (barotrauma) caused by this may be permanent. HOME CARE INSTRUCTIONS   Use medicines as recommended by your caregiver. Over the counter medicines will help unblock the canal and can help during times of air travel.  Do not put anything into your ears to clean or unplug them. Eardrops will not be helpful.  Do not swim, dive, or fly until your caregiver says it is all right to do so. If these activities are necessary, chewing gum with frequent swallowing may help. It is also helpful to hold your nose and gently blow to pop your ears for equalizing pressure changes. This forces air into the Eustachian tube.  For little ones with problems, give your baby a bottle of water or juice during periods when pressure changes would be anticipated such as during take offs and landings associated with air travel.  Only take over-the-counter or prescription medicines for pain, discomfort, or fever as directed by your caregiver.  A decongestant may be helpful in de-congesting the middle ear and make pressure equalization easier. This can be even more effective if the drops (spray) are delivered with the head lying over the edge of a bed with the head tilted toward the ear on the affected side.  If your caregiver has given you a follow-up appointment, it is very important to keep that appointment. Not keeping the  appointment could result in a chronic or permanent injury, pain, hearing loss and disability. If there is any problem keeping the appointment, you must call back to this facility for assistance. SEEK IMMEDIATE MEDICAL CARE IF:   You develop a severe headache, dizziness, severe ear pain, or bloody or pus-like drainage from your ears.  An oral temperature above 102 F (38.9 C) develops.  Your problems do not improve or become worse. MAKE SURE YOU:   Understand these instructions.  Will watch your condition.  Will get help right away if you are not doing well or get worse. Document Released: 12/13/2000 Document Revised: 03/09/2012 Document Reviewed: 07/21/2008 [facility] Patient Information 2014 [place], [place].  Edema Edema is an abnormal build-up of fluids in tissues. Because this is partly dependent on gravity (water flows to the lowest place), it is more common in the legs and thighs (lower extremities). It is also common in the looser tissues, like around the eyes. Painless swelling of the feet and ankles is common and increases as a person ages. It may affect both legs and may include the calves or even thighs. When squeezed, the fluid may move out of the affected area and may leave a dent for a few moments. CAUSES   Prolonged standing or sitting in one place for extended periods of time. Movement helps pump tissue fluid into the veins, and absence of movement prevents this, resulting in edema.  Varicose veins. The valves in the veins do not work as well as they should. This causes fluid to leak into  the tissues.  Fluid and salt overload.  Injury, burn, or surgery to the leg, ankle, or foot, may damage veins and allow fluid to leak out.  Sunburn damages vessels. Leaky vessels allow fluid to go out into the sunburned tissues.  Allergies (from insect bites or stings, medications or chemicals) cause swelling by allowing vessels to become leaky.  Protein in the blood helps keep fluid  in your vessels. Low protein, as in malnutrition, allows fluid to leak out.  Hormonal changes, including pregnancy and menstruation, cause fluid retention. This fluid may leak out of vessels and cause edema.  Medications that cause fluid retention. Examples are sex hormones, blood pressure medications, steroid treatment, or anti-depressants.  Some illnesses cause edema, especially heart failure, kidney disease, or liver disease.  Surgery that cuts veins or lymph nodes, such as surgery done for the heart or for breast cancer, may result in edema. DIAGNOSIS  Your caregiver is usually easily able to determine what is causing your swelling (edema) by simply asking what is wrong (getting a history) and examining you (doing a physical). Sometimes x-rays, EKG (electrocardiogram or heart tracing), and blood work may be done to evaluate for underlying medical illness. TREATMENT  General treatment includes:  Leg elevation (or elevation of the affected body part).  Restriction of fluid intake.  Prevention of fluid overload.  Compression of the affected body part. Compression with elastic bandages or support stockings squeezes the tissues, preventing fluid from entering and forcing it back into the blood vessels.  Diuretics (also called water pills or fluid pills) pull fluid out of your body in the form of increased urination. These are effective in reducing the swelling, but can have side effects and must be used only under your caregiver's supervision. Diuretics are appropriate only for some types of edema. The specific treatment can be directed at any underlying causes discovered. Heart, liver, or kidney disease should be treated appropriately. HOME CARE INSTRUCTIONS   Elevate the legs (or affected body part) above the level of the heart, while lying down.  Avoid sitting or standing still for prolonged periods of time.  Avoid putting anything directly under the knees when lying down, and do not  wear constricting clothing or garters on the upper legs.  Exercising the legs causes the fluid to work back into the veins and lymphatic channels. This may help the swelling go down.  The pressure applied by elastic bandages or support stockings can help reduce ankle swelling.  A low-salt diet may help reduce fluid retention and decrease the ankle swelling.  Take any medications exactly as prescribed. SEEK MEDICAL CARE IF:  Your edema is not responding to recommended treatments. SEEK IMMEDIATE MEDICAL CARE IF:   You develop shortness of breath or chest pain.  You cannot breathe when you lay down; or if, while lying down, you have to get up and go to the window to get your breath.  You are having increasing swelling without relief from treatment.  You develop a fever over 102 F (38.9 C).  You develop pain or redness in the areas that are swollen.  Tell your caregiver right away if you have gained 3 lb/1.4 kg in 1 day or 5 lb/2.3 kg in a week. MAKE SURE YOU:   Understand these instructions.  Will watch your condition.  Will get help right away if you are not doing well or get worse. Document Released: 12/16/2005 Document Revised: 06/16/2012 Document Reviewed: 08/03/2008 [facility] Patient Information 2014 [place], [place].

## 2013-10-17 NOTE — Progress Notes (Signed)
  Subjective:    Patient ID: Kenneth Lin, male    DOB: 1930-08-11, 79 y.o.   MRN: [id number]  HPI Patient presents to the clinic with left ear discomfort and hearing loss. He noticed symptoms a couple of days ago. He has hearing loss that is ongoing but was not able to hear high pitched beeps on microwave and when he laid down and felt a pop he could hear again but then the next day ears felt congested again. He has some discomfort in his left ear but no pain. He has not noticed any discharge. Denies fever, chills, sinus pressure, ST. Not tried anything to make better.   He continues to have daily lower edema swelling of bilteral ankles and feet. [name] increase Cardizem to see if that would help symptoms. Edema remains unchanged. He admits to not walking a lot and when he sits not elevating his feet.    Review of Systems     Objective:   Physical Exam  Constitutional: He is oriented to person, place, and time. He appears well-developed and well-nourished.  HENT:  Head: Normocephalic and atraumatic.  Right Ear: External ear normal.  Mouth/Throat: Oropharynx is clear and moist. No oropharyngeal exudate.  Right ear TM normal.  Left TM erythema around TM. No blood or pus. Some fluid apparent behind TM. No pain over tragus.   No sinus tenderness or pressure.   Eyes: Conjunctivae are normal. Right eye exhibits no discharge. Left eye exhibits no discharge.  Neck: Normal range of motion. Neck supple.  Cardiovascular: Normal heart sounds.   Irregular Irregular rhythm.   Pulmonary/Chest: Effort normal and breath sounds normal. He has no wheezes.  Lymphadenopathy:    He has no cervical adenopathy.  Neurological: He is alert and oriented to person, place, and time.  Skin:  1+ pitting edema over bilateral ankles and feet.   Psychiatric: He has a normal mood and affect. His behavior is normal.          Assessment & Plan:  Eustachian Tube Dysfunction- discussed with patient no  infection. There was some inflammation in left ear. Gave hyrdrocortisone/acetic acid solution to use for next 5-7 days. Also gave flonase to help with fluid drainage.If not improving or worsening call office.   Edema- Gave handout. Discussed elevation, walking more, compression stockings. I agree with [name]. Low suspicion of CHF or renal failure. Last renal labs were great. Discussed starting a diuretic today low dose. Pt did not want to add any medication today. Told him to follow up with PCP if he would like to discuss other options if symptomatic options do not improve edema.

## 2013-10-19 ENCOUNTER — Encounter: Payer: Self-pay | Admitting: Family Medicine

## 2013-10-19 ENCOUNTER — Ambulatory Visit (INDEPENDENT_AMBULATORY_CARE_PROVIDER_SITE_OTHER): Payer: Medicare Other | Admitting: Family Medicine

## 2013-10-19 DIAGNOSIS — Z23 Encounter for immunization: Secondary | ICD-10-CM

## 2013-10-19 NOTE — Progress Notes (Signed)
I was present for all necessary aspects of today's visit 

## 2013-10-25 IMAGING — CR DG LUMBAR SPINE COMPLETE 4+V
6 series · 6 of 6 positions shown · non-contrast
Comparison: 06/29/2011

CLINICAL DATA: Right hip pain

LUMBAR SPINE - COMPLETE 4+ VIEW

[view not recorded (1 of 6)]
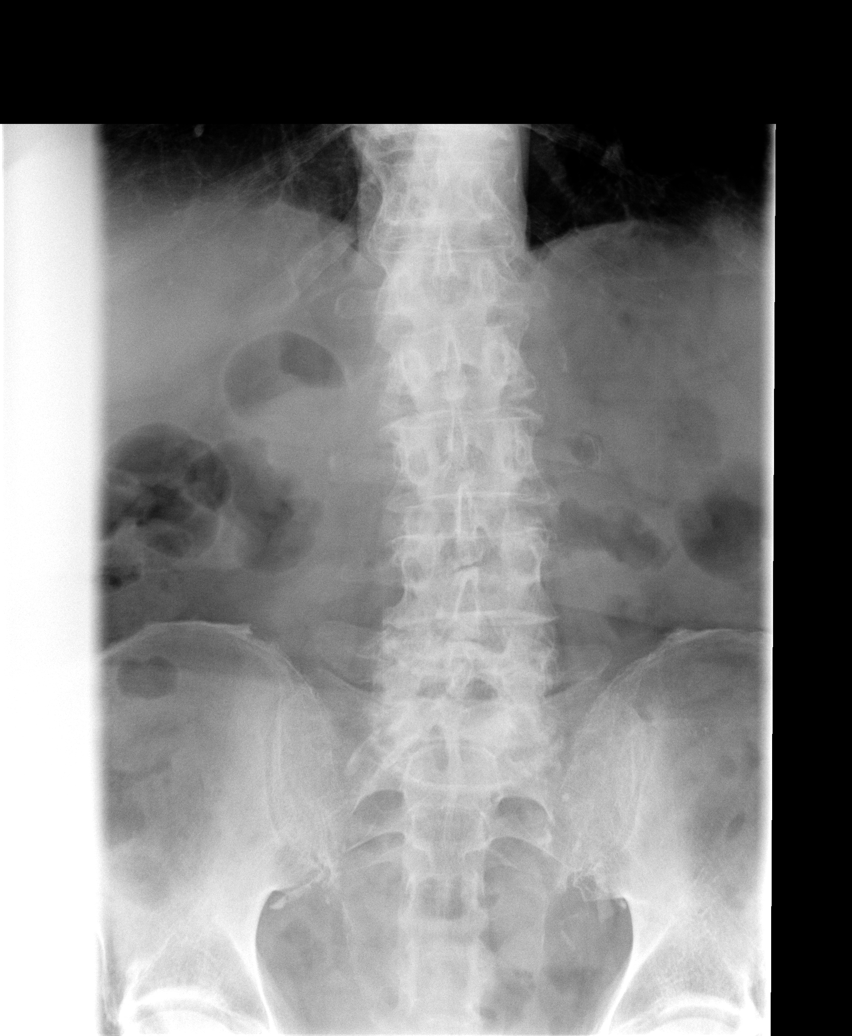

[view not recorded (2 of 6)]
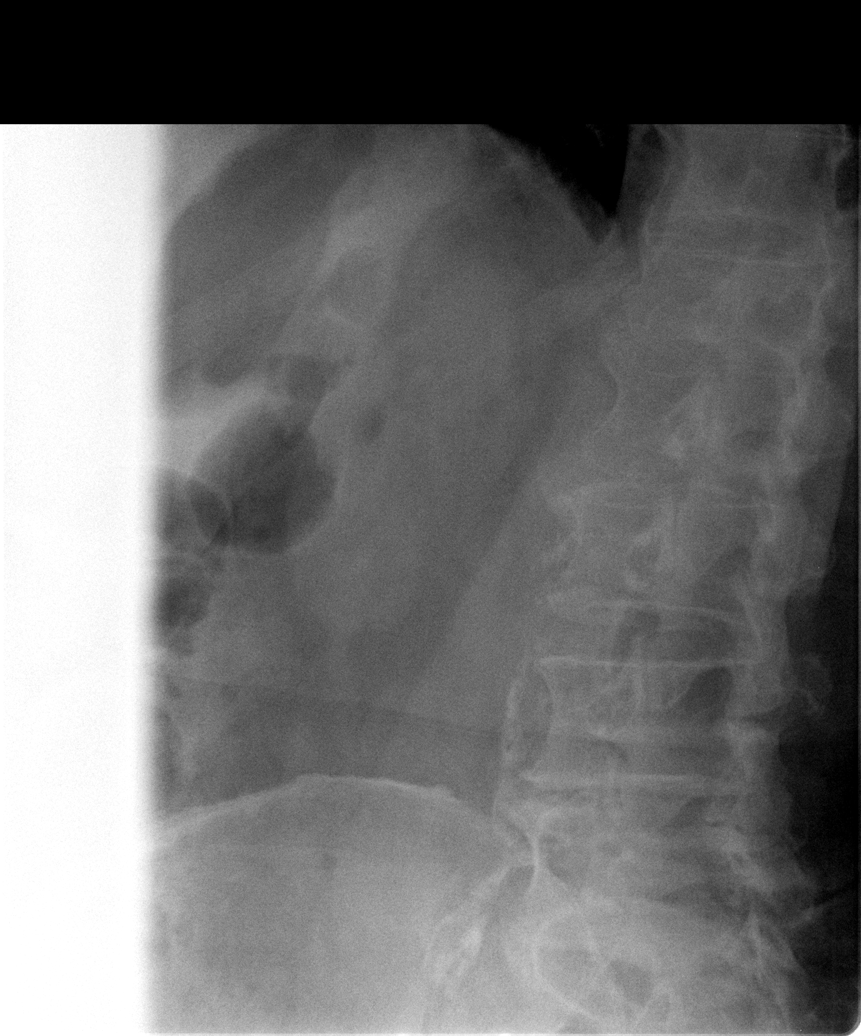

[view not recorded (3 of 6)]
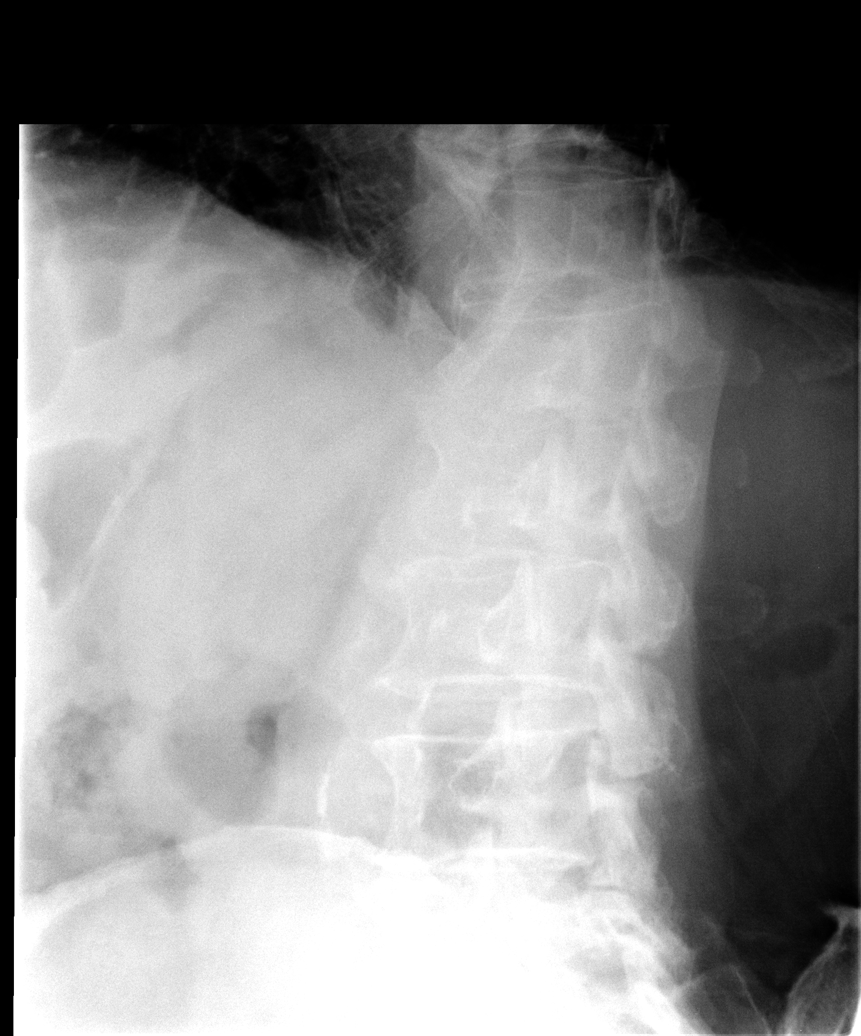

[view not recorded (4 of 6)]
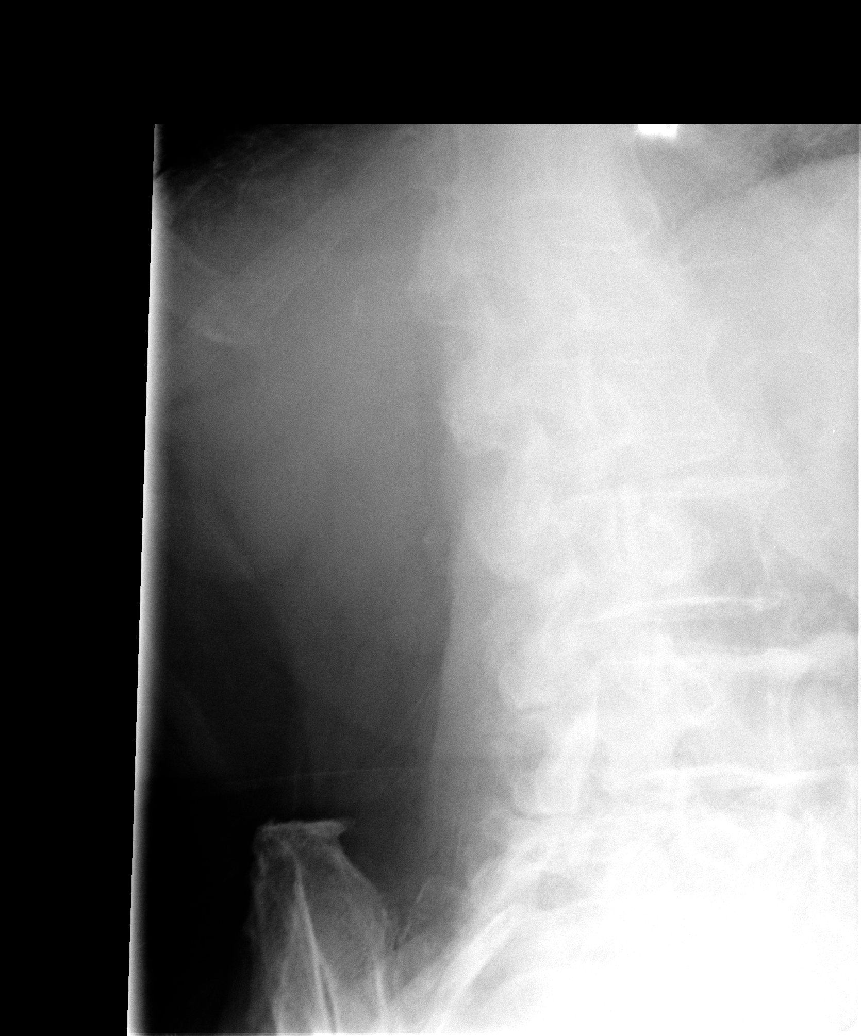

[view not recorded (5 of 6)]
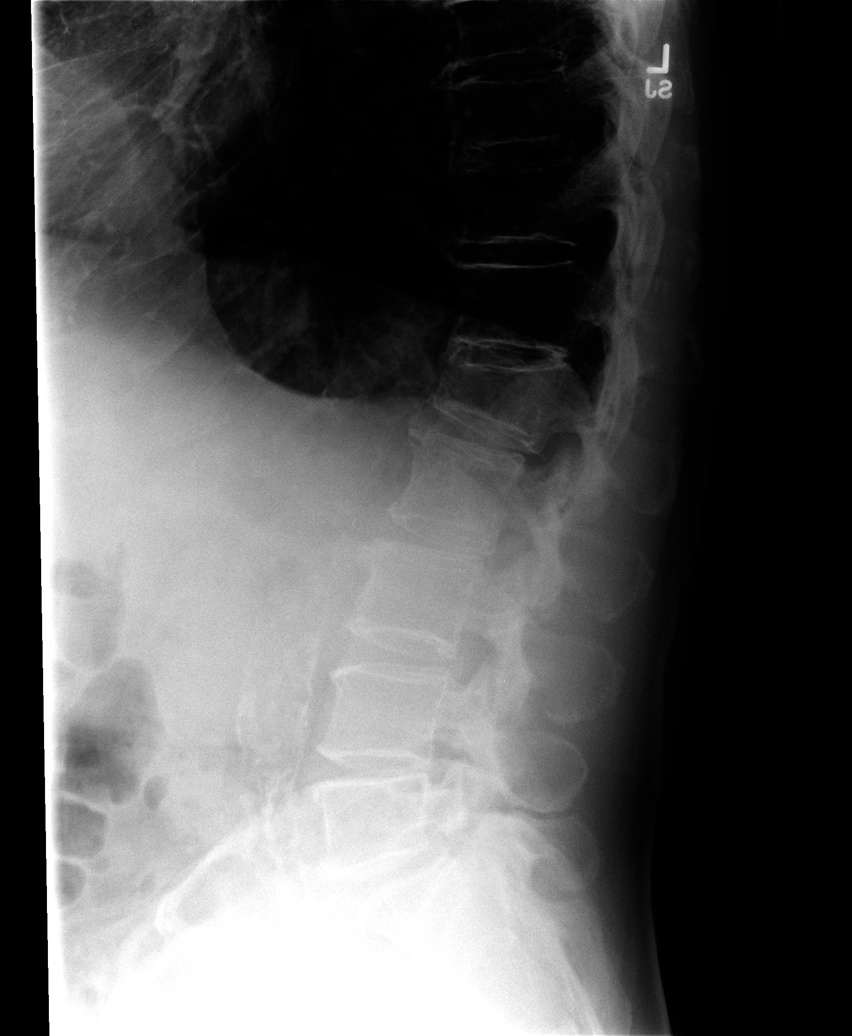

[view not recorded (6 of 6)]
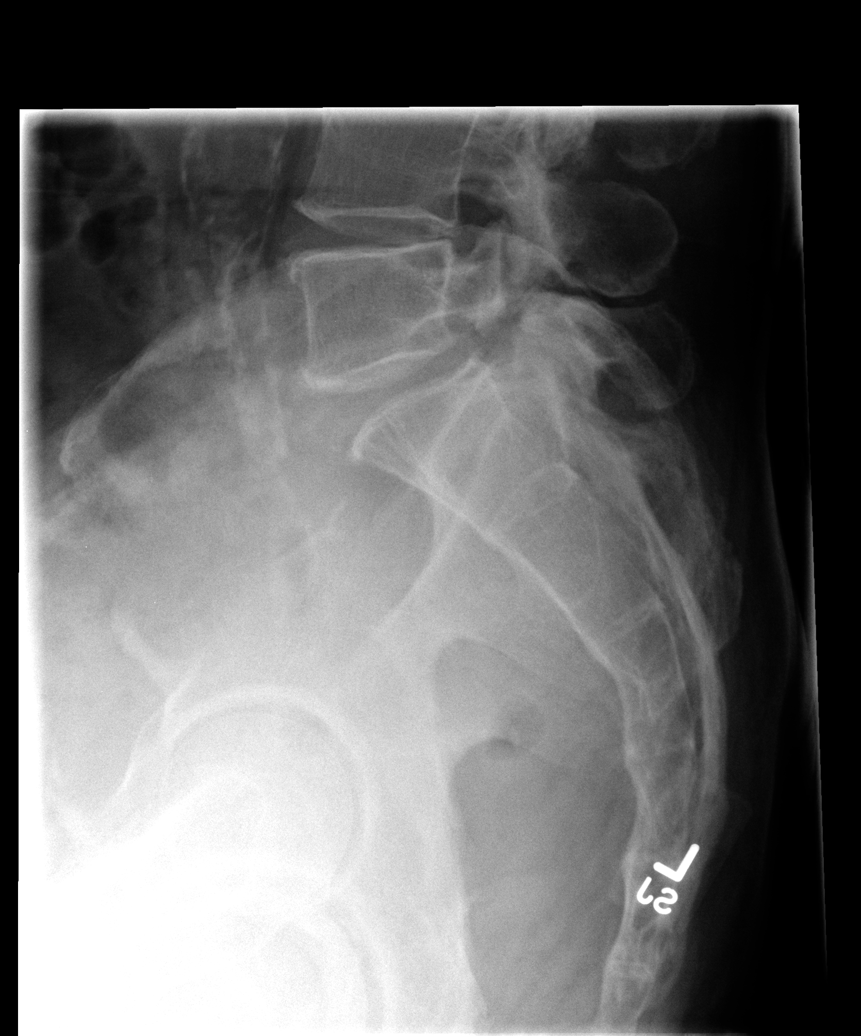

[6 of 6 positions shown; findings below may reference images not displayed]

FINDINGS: Stable vertebral compression fracture deformity of L1
since previous exam.  No new fracture.  Normal alignment.  Small
anterior endplate spurs at all lumbar levels.  Probable bilateral
L5 pars defects, with approximately 4 mm anterolisthesis of L5 on
S1.  There is narrowing of the L4-5 interspace.  Patchy aortic
calcifications without evidence of aneurysm.
IMPRESSION: 1.  Stable old L1 compression fracture deformity.
2.  Bilateral L4-L5 pars defects with grade 1 anterolisthesis L5-
S1.

## 2013-10-27 ENCOUNTER — Telehealth: Payer: Self-pay | Admitting: Physician Assistant

## 2013-10-27 NOTE — Telephone Encounter (Signed)
To note for whoever calls pt back.

## 2013-10-27 NOTE — Telephone Encounter (Signed)
Pt wants to be called back on his cell phone

## 2013-10-27 NOTE — Telephone Encounter (Signed)
Make sure using flonase this should allow ears to drain if fluid remains in them.

## 2013-10-27 NOTE — Telephone Encounter (Signed)
Pt called. His hearing is no better and wonders if script needs to be changed.

## 2013-11-15 ENCOUNTER — Other Ambulatory Visit: Payer: Self-pay | Admitting: Family Medicine

## 2013-12-06 ENCOUNTER — Other Ambulatory Visit: Payer: Self-pay | Admitting: Physician Assistant

## 2013-12-13 ENCOUNTER — Other Ambulatory Visit: Payer: Self-pay | Admitting: Physician Assistant

## 2013-12-15 ENCOUNTER — Encounter: Payer: Self-pay | Admitting: Family Medicine

## 2013-12-15 ENCOUNTER — Ambulatory Visit: Payer: Medicare Other | Admitting: Cardiology

## 2013-12-28 ENCOUNTER — Other Ambulatory Visit: Payer: Self-pay | Admitting: Family Medicine

## 2013-12-29 ENCOUNTER — Encounter: Payer: Self-pay | Admitting: Sports Medicine

## 2013-12-29 ENCOUNTER — Ambulatory Visit (INDEPENDENT_AMBULATORY_CARE_PROVIDER_SITE_OTHER): Payer: Medicare Other | Admitting: Sports Medicine

## 2013-12-29 VITALS — BP 169/92 | HR 81 | Wt 154.0 lb

## 2013-12-29 DIAGNOSIS — T07XXXA Unspecified multiple injuries, initial encounter: Secondary | ICD-10-CM

## 2013-12-29 DIAGNOSIS — IMO0001 Reserved for inherently not codable concepts without codable children: Secondary | ICD-10-CM

## 2013-12-29 NOTE — Progress Notes (Signed)
  Subjective:    CC: Skin peeling  HPI: Kenneth Lin comes back to see me, he has a complaint of a change in the skin on his right hand. There is what appears to be peeling of the skin, and he was worried and wanted me to see it to prevent infection. He has no pain, no swelling, no redness.  Scratch: On his left arm his dog scratched and torso of the skin. He has had no swelling, no erythema, no fevers or chills, pain is mild, and improving. He is extremely worried that he will develop blood infection and is asking me to check blood cultures.  Past medical history, Surgical history, Family history not pertinant except as noted below, Social history, Allergies, and medications have been entered into the medical record, reviewed, and no changes needed.   Review of Systems: No fevers, chills, night sweats, weight loss, chest pain, or shortness of breath.   Objective:    General: Well Developed, well nourished, and in no acute distress.  Neuro: Alert and oriented x3, extra-ocular muscles intact, sensation grossly intact.  HEENT: Normocephalic, atraumatic, pupils equal round reactive to light, neck supple, no masses, no lymphadenopathy, thyroid nonpalpable.  Skin: Warm and dry, no rashes. Cardiac: Regular rate and rhythm, no murmurs rubs or gallops, no lower extremity edema.  Respiratory: Clear to auscultation bilaterally. Not using accessory muscles, speaking in full sentences. Right hand: There is some adhesive from his dentures, with paper towels stuck to it, this was rubbed off with alcohol, and the skin was completely unremarkable. Left arm: There's a small abrasion without erythema, induration, swelling, or tenderness. There is overlying scab.  Impression and Recommendations:   I spent 25 minutes with this patient, greater than 50% was face-to-face time counseling regarding the above diagnoses.

## 2013-12-29 NOTE — Assessment & Plan Note (Signed)
This does not look infected, however there is very mild erythema. I applied some topical antibiotic ointment and a Band-Aid. Kenneth Lin is extremely concerned about a blood infection and essentially demands a blood culture. He also had what he thought was peeling skin on his right hand, it appeared to be simply dental adhesive. We will do this.

## 2014-01-04 LAB — CULTURE, BLOOD (SINGLE): Organism ID, Bacteria: NO GROWTH

## 2014-01-10 ENCOUNTER — Other Ambulatory Visit: Payer: Self-pay | Admitting: Family Medicine

## 2014-01-14 ENCOUNTER — Other Ambulatory Visit: Payer: Self-pay | Admitting: Family Medicine

## 2014-01-26 ENCOUNTER — Telehealth: Payer: Self-pay | Admitting: *Deleted

## 2014-01-26 ENCOUNTER — Ambulatory Visit (INDEPENDENT_AMBULATORY_CARE_PROVIDER_SITE_OTHER): Payer: Medicare Other | Admitting: Family Medicine

## 2014-01-26 ENCOUNTER — Encounter: Payer: Self-pay | Admitting: Family Medicine

## 2014-01-26 VITALS — BP 148/98 | HR 103 | Wt 151.0 lb

## 2014-01-26 DIAGNOSIS — M25551 Pain in right hip: Secondary | ICD-10-CM

## 2014-01-26 DIAGNOSIS — M25559 Pain in unspecified hip: Secondary | ICD-10-CM

## 2014-01-26 DIAGNOSIS — I4891 Unspecified atrial fibrillation: Secondary | ICD-10-CM

## 2014-01-26 DIAGNOSIS — I480 Paroxysmal atrial fibrillation: Secondary | ICD-10-CM

## 2014-01-26 MED ORDER — PREDNISONE 20 MG PO TABS: ORAL_TABLET | ORAL | Status: DC

## 2014-01-26 MED ORDER — METHYLPREDNISOLONE SODIUM SUCC 125 MG IJ SOLR
125.0000 mg | Freq: Once | INTRAMUSCULAR | Status: AC
  Administered 2014-01-26: 125 mg via INTRAMUSCULAR

## 2014-01-26 NOTE — Progress Notes (Signed)
CC: Kenneth Lin is a 80 y.o. male is here for right hip pain   Subjective: HPI:  Patient planes the right hip pain localized on the lateral aspect of the right hip which has been present for years however significantly worse over the past 2 weeks. Described as tingling and burning blowtorch. Improves with activity worse in the evening when lying down. Slightly improves with ibuprofen which she has been taking 400 mg 3 times a day for the past 2 days. He denies any motor or sensory disturbances in extremities other than the above. Denies weakness, nor groin pain. Denies back pain, saddle paresthesia nor bowel or bladder incontinence. There's been no recent trauma or overexertion. Denies overlying skin changes at the site of discomfort  When I saw him last we increased the diltiazem due to suspicion that elevated heart rate in the setting of atrial fibrillation was causing dependent edema. He reports that edema is now absent and denies any irregular heartbeat or chest pain   Review Of Systems Outlined In HPI  Past Medical History  Diagnosis Date  . COPD (chronic obstructive pulmonary disease)   . Paroxysmal atrial fibrillation     [facility]    . Essential hypertension, benign   . Other and unspecified hyperlipidemia   . Peripheral artery disease     Left ABI of leg abnormal.       Family History  Problem Relation Age of Onset  . Heart failure Mother   . Cancer Father      History  Substance Use Topics  . Smoking status: Former Games developermoker  . Smokeless tobacco: Not on file  . Alcohol Use: No     Objective: Filed Vitals:   01/26/14 1340  BP: 148/98  Pulse: 103    General: Alert and Oriented, No Acute Distress HEENT: Pupils equal, round, reactive to light. Conjunctivae clear.  Moist mucous membranes pharynx unremarkable Lungs: Clear to auscultation bilaterally, no wheezing/ronchi/rales.  Comfortable work of breathing. Good air movement. Cardiac: Regular rate  and rhythm. Normal S1/S2.  No murmurs, rubs, nor gallops.   Extremities: No peripheral edema.  Strong peripheral pulses. Exam of the right leg shows L4 and S1 DTRs two over four bilaterally with full-strength and range of motion of the entire leg. Logroll negative, femoral grind negative, Faber negative, straight leg raise negative.  Back: No midline spinous process tenderness in the lumbar region or paraspinal musculature tenderness. No SI joint tenderness on the right or left. Mental Status: No depression, anxiety, nor agitation. Skin: Warm and dry.  Assessment & Plan: Kenneth Lin was seen today for right hip pain.  Diagnoses and associated orders for this visit:  Right hip pain - predniSONE (DELTASONE) 20 MG tablet; Three tabs daily days 1-3, two tabs daily days 4-6, one tab daily days 7-9, half tab daily days 10-13. - methylPREDNISolone sodium succinate (SOLU-MEDROL) 125 mg/2 mL injection 125 mg; Inject 2 mLs (125 mg total) into the muscle once.  Paroxysmal atrial fibrillation    Right hip pain: Suspect this is due to a recurrence of lumbar radiculitis and meralgia paresthetica which responded well to prednisone and Mobic last time. This time can take ibuprofen which yard he has at home. Solu-Medrol today since symptoms are significantly interfering with sleep past 2 nights Paroxysmal atrial fibrillation: Controlled better rate control continue on diltiazem  Return if symptoms worsen or fail to improve.

## 2014-01-26 NOTE — Telephone Encounter (Signed)
Pt called and states on vm that his hip is burning and hurting. Pt has made an appt for today at 130. Can discuss and eval at appt time

## 2014-01-28 ENCOUNTER — Encounter: Payer: Self-pay | Admitting: Family Medicine

## 2014-01-28 ENCOUNTER — Ambulatory Visit (INDEPENDENT_AMBULATORY_CARE_PROVIDER_SITE_OTHER): Payer: Medicare Other | Admitting: Family Medicine

## 2014-01-28 VITALS — BP 162/90 | HR 55 | Wt 149.0 lb

## 2014-01-28 DIAGNOSIS — L853 Xerosis cutis: Secondary | ICD-10-CM

## 2014-01-28 DIAGNOSIS — M25551 Pain in right hip: Secondary | ICD-10-CM

## 2014-01-28 DIAGNOSIS — M25559 Pain in unspecified hip: Secondary | ICD-10-CM

## 2014-01-28 DIAGNOSIS — L738 Other specified follicular disorders: Secondary | ICD-10-CM

## 2014-01-28 MED ORDER — PREDNISONE 20 MG PO TABS: ORAL_TABLET | ORAL | Status: DC

## 2014-01-28 NOTE — Progress Notes (Signed)
CC: Kenneth Lin is a 80 y.o. male is here for hip pain improved but not resolved   Subjective: HPI:  Patient presents due to fears of the week and approaching and the possibility that his hip pain could deteriorate. Fortunately he reports significant improvement of pain and burning since I saw him last when he received Solu-Medrol and started on a prednisone taper. His pain is still localized on the right lateral hip extending slightly distally anteriorly. Character and pain severity has improved is still described as a burning. It is worse when sitting for long periods of time, flares improve with ibuprofen 600 mg. He denies any new motor or sensory disturbances. He is wondering if he should have another injection of Solu-Medrol.  He has many well-thought-out questions regarding whether or not this is actually bursitis.  Patient complains of skin dryness on the back of the left hand located between the knuckles of the first and second finger. It has been present for the past 2 weeks. He came on after he scraped this location against a glass surface. No intervention is as of yet. He is wondering if she should be worried about the flaking of his skin. It is painless.  Review Of Systems Outlined In HPI  Past Medical History  Diagnosis Date  . COPD (chronic obstructive pulmonary disease)   . Paroxysmal atrial fibrillation     Seeing [name] at [facility].    . Essential hypertension, benign   . Other and unspecified hyperlipidemia   . Peripheral artery disease     Left ABI of leg abnormal.       Family History  Problem Relation Age of Onset  . Heart failure Mother   . Cancer Father      History  Substance Use Topics  . Smoking status: Former Games developer  . Smokeless tobacco: Not on file  . Alcohol Use: No     Objective: Filed Vitals:   01/28/14 1036  BP: 162/90  Pulse: 55    General: Alert and Oriented, No Acute Distress HEENT: Pupils equal, round, reactive to light.  Conjunctivae clear.  Moist mucous membranes pharynx unremarkable Lungs: Clear comfortable work of breathing Cardiac: Regular rate and rhythm Extremities: No peripheral edema.  Strong peripheral pulses.  Full range of motion strength of the right lower extremity. There is no pain over the greater trochanter when palpated. Mental Status: No depression, anxiety, nor agitation. Skin: Warm and dry. No abnormal skin changes overlying the site of his discomfort. Mild dryness between the knuckles of the first and second fingers.  Assessment & Plan: Kenneth Lin was seen today for hip pain improved but not resolved.  Diagnoses and associated orders for this visit:  Right hip pain - predniSONE (DELTASONE) 20 MG tablet; Extending taper with three by mouth every morning this Saturday and Sunday.  Dry skin    Right hip pain: Improved we had a long discussion regarding that there is no need for Solu-Medrol injection at this time since he is on full dose prednisone. He is extremely anxious over the weekend symptoms could deteriorate once he starts to taper down on prednisone. We will extend the full dose portion of the taper through Sunday so that if he has any worsening of his pain we will be available on Monday when he starts to taper down. It took quite a bit of convincing that his pain is unlikely due to greater trochanteric bursitis based on physical exam. Dry skin: He took quite a bit of convincing  to understand that the dry skin can be remedied by most over-the-counter moisturizers applied as needed and that there is no threat to his health with this regimen as long as it does not result in cracking of the skin, redness, nor swelling.  40 minutes spent face-to-face during visit today of which at least 50% was counseling or coordinating care regarding: 1. Right hip pain   2. Dry skin      Return if symptoms worsen or fail to improve.

## 2014-02-07 ENCOUNTER — Other Ambulatory Visit: Payer: Self-pay | Admitting: Physician Assistant

## 2014-02-08 ENCOUNTER — Encounter: Payer: Self-pay | Admitting: Family Medicine

## 2014-02-08 ENCOUNTER — Telehealth: Payer: Self-pay | Admitting: Family Medicine

## 2014-02-08 ENCOUNTER — Ambulatory Visit (INDEPENDENT_AMBULATORY_CARE_PROVIDER_SITE_OTHER): Payer: Medicare Other | Admitting: Family Medicine

## 2014-02-08 VITALS — BP 164/97 | HR 107 | Temp 97.9°F | Wt 152.0 lb

## 2014-02-08 DIAGNOSIS — J449 Chronic obstructive pulmonary disease, unspecified: Secondary | ICD-10-CM

## 2014-02-08 DIAGNOSIS — J441 Chronic obstructive pulmonary disease with (acute) exacerbation: Secondary | ICD-10-CM

## 2014-02-08 DIAGNOSIS — M25551 Pain in right hip: Secondary | ICD-10-CM

## 2014-02-08 DIAGNOSIS — M7989 Other specified soft tissue disorders: Secondary | ICD-10-CM

## 2014-02-08 DIAGNOSIS — R600 Localized edema: Secondary | ICD-10-CM

## 2014-02-08 DIAGNOSIS — I10 Essential (primary) hypertension: Secondary | ICD-10-CM

## 2014-02-08 DIAGNOSIS — M25559 Pain in unspecified hip: Secondary | ICD-10-CM

## 2014-02-08 LAB — BASIC METABOLIC PANEL WITH GFR
BUN: 17 mg/dL (ref 6–23)
CO2: 29 meq/L (ref 19–32)
Calcium: 9.9 mg/dL (ref 8.4–10.5)
Chloride: 101 mEq/L (ref 96–112)
Creat: 0.87 mg/dL (ref 0.50–1.35)
GFR, Est African American: >89 mL/min
GFR, Est Non African American: 81 mL/min
Glucose, Bld: 115 mg/dL — ABNORMAL HIGH (ref 70–99)
Potassium: 3.9 mEq/L (ref 3.5–5.3)
SODIUM: 140 meq/L (ref 135–145)

## 2014-02-08 LAB — D-DIMER, QUANTITATIVE: D-Dimer, Quant: 0.52 ug/mL-FEU — ABNORMAL HIGH (ref 0.00–0.48)

## 2014-02-08 MED ORDER — PREDNISONE 20 MG PO TABS: ORAL_TABLET | ORAL | Status: DC

## 2014-02-08 MED ORDER — AZITHROMYCIN 250 MG PO TABS: ORAL_TABLET | ORAL | Status: DC

## 2014-02-08 NOTE — Telephone Encounter (Signed)
[name], Will you please let Mr. Kenneth Lin know that his blood work showed that his kidneys are working perfect and not the cause of his swelling.  The DVT clot test was inconclusive therefore I'd recommend he have a ultrasound of his leg.  Can [facility] at [facility] for early Wednesday morning?

## 2014-02-08 NOTE — Telephone Encounter (Signed)
Pt informed and was informed that we would call with the appt for the US. Victorino DikeJennifer is scheduling the US appt.  Meyer CoryMisty Ahmad, LPN

## 2014-02-08 NOTE — Progress Notes (Signed)
CC: Kenneth Lin is a 80 y.o. male is here for COPD and Hip Pain   Subjective: HPI:  Same day appointment complaining of sputum production increased from baseline accompanied by mild shortness of breath with exertion. Continues on Spiriva and Symbicort. He's not sure how often he is using albuterol. Accompanied by fatigue this morning symptoms have been present for the past 3 days. Nothing particularly makes better or worse other than that listed above. He is without chest pain, confusion, night sweats, blood and sputum, nor back pain. No interventions as of yet. Symptoms are present all hours of the day overall moderate in severity per his report.  Complains that the neurology referral he requested for his right hip pain was uninformative.  He states that he was not given any recommendations or explanation as to where his pain is coming from.  Looking at the office note from this visit his urologist did feel that his hip pain had a component of lateral femoral cutaneous nerve impingement but also a musculoskeletal etiology as well. He states that his pain improves when on prednisone but when he begins to taper down pain slowly returns. He is no longer responsive to ibuprofen. He still localizes the pain in the lateral right thigh and now somewhat in the groin.  He complains that he's had bilateral swelling right greater than left for the past week that seems to be persistent throughout the day nothing makes better or worse. In hindsight he's realized that he is not been taking diltiazem for somewhere between 2-3 weeks. He denies skin changes in the right lower extremity nor any new pain in the lower right extremity. Denies rapid heartbeat nor any motor or sensory disturbances other than that described above.  Essential hypertension: He has had elevated blood pressures on recent visits however this would make sense given his lapse in taking diltiazem. He restarted diltiazem yesterday.   Review Of  Systems Outlined In HPI  Past Medical History  Diagnosis Date  . COPD (chronic obstructive pulmonary disease)   . Paroxysmal atrial fibrillation     Seeing [name] at [facility].    . Essential hypertension, benign   . Other and unspecified hyperlipidemia   . Peripheral artery disease     Left ABI of leg abnormal.      No past surgical history on file. Family History  Problem Relation Age of Onset  . Heart failure Mother   . Cancer Father     History   Social History  . Marital Status: Married    Spouse Name: N/A    Number of Children: 2  . 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 Smoking status: Former Games developer  . Smokeless tobacco: Not on file  . Alcohol Use: No  . Drug Use: No  . Sexual Activity: Not on file   Other Topics Concern  . Not on file   Social History Narrative  . No narrative on file     Objective: BP 164/97  Pulse 107  Temp(Src) 97.9 F (36.6 C) (Oral)  Wt 152 lb (68.947 kg)  General: Alert and Oriented, No Acute Distress HEENT: Pupils equal, round, reactive to light. Conjunctivae clear.  External ears unremarkable, canals clear with intact TMs with appropriate landmarks.  Middle ear appears open without effusion. Pink inferior turbinates.  Moist mucous membranes, pharynx without inflammation nor lesions.  Neck supple without palpable lymphadenopathy nor abnormal  masses. Lungs: Comfortable work of breathing with mild central rhonchi no wheezing and no Rales nor signs of consolidation. Cardiac: Irregular irregular rhythm. Normal S1/S2.  No murmurs, rubs, nor gallops.   Extremities: No peripheral edema.  Strong peripheral pulses.  1+ pitting edema bilaterally more so on the right there is no overlying skin changes in either extremity and there is no palpable cord in the calf bilaterally Mental Status: No depression, anxiety, nor agitation. Skin: Warm and dry.  Assessment & Plan: Kenneth Lin  was seen today for copd and hip pain.  Diagnoses and associated orders for this visit:  COPD  Essential hypertension, benign - BASIC METABOLIC PANEL WITH GFR  Right hip pain - predniSONE (DELTASONE) 20 MG tablet; Three tabs daily days 1-3, two tabs daily days 4-6, one tab daily days 7-9, half tab daily days 10-13.  COPD exacerbation - predniSONE (DELTASONE) 20 MG tablet; Three tabs daily days 1-3, two tabs daily days 4-6, one tab daily days 7-9, half tab daily days 10-13. - azithromycin (ZITHROMAX) 250 MG tablet; Take two tabs at once on day 1, then one tab daily on days 2-5.  Right leg swelling - D-dimer, quantitative    COPD exacerbation: Restart prednisone taper and also adding azithromycin encouraged to use albuterol every 4 hours for the next 48 hours then as needed Essential hypertension: Controlled due to stopping diltiazem, return to once daily dosing Right hip pain: I've asked him to follow up with Dr. Karie Schwalbe. for consideration of advanced imaging, he states that the burning component has significantly subsided since I saw him last therefore we will not act on adding Lyrica per neurology's recommendations in less pain returns Right leg swelling: Given mild asymmetric presentation of lower showing edema and obtaining d-dimer, DVT suspicion is present however low  40 minutes spent face-to-face during visit today of which at least 50% was counseling or coordinating care regarding: 1. COPD   2. Essential hypertension, benign   3. Right hip pain   4. COPD exacerbation   5. Right leg swelling        Return in about 4 weeks (around 03/08/2014), or if symptoms worsen or fail to improve.

## 2014-02-09 ENCOUNTER — Telehealth: Payer: Self-pay | Admitting: Family Medicine

## 2014-02-09 NOTE — Telephone Encounter (Signed)
Patient called advised that he is having a little trouble breathing and the thing he puts on his finger to check his breathing or oxygen levels is broke. I advised pt that he is welcomed to go to urgent care if he is having trouble breathing and pt said he will just use his oxygen machine for the night and call in the morning if he is not feeling better. Thanks

## 2014-02-09 NOTE — Telephone Encounter (Signed)
Sue LushAndrea, I had hoped for Mr. Kenneth Lin to have an venous ultrasound of his right leg early this morning, can you see if radiology at [facility] has any results or feedback on this.

## 2014-02-09 NOTE — Telephone Encounter (Signed)
Sue LushAndrea, We please let Mr. Kenneth CongerBarker know that his ultrasound was negative for DVT

## 2014-02-09 NOTE — Telephone Encounter (Signed)
Called [facility] and it was scheduled [facility]. The appt was at 2:00. I called there ([phone]) and they told me that results were not avail since it was not scheduled STAT. They did however change it to STAT and someone should call with results before the end of today

## 2014-02-09 NOTE — Telephone Encounter (Signed)
Pt aware.

## 2014-02-11 ENCOUNTER — Ambulatory Visit: Payer: Medicare Other | Admitting: Physician Assistant

## 2014-02-11 ENCOUNTER — Emergency Department (INDEPENDENT_AMBULATORY_CARE_PROVIDER_SITE_OTHER)
Admission: EM | Admit: 2014-02-11 | Discharge: 2014-02-11 | Disposition: A | Discharge status: Other Acute Inpatient Hospital | Payer: Medicare Other | Source: Home / Self Care | Attending: Emergency Medicine | Admitting: Emergency Medicine

## 2014-02-11 ENCOUNTER — Telehealth: Payer: Self-pay | Admitting: *Deleted

## 2014-02-11 ENCOUNTER — Encounter: Payer: Self-pay | Admitting: Emergency Medicine

## 2014-02-11 DIAGNOSIS — J9620 Acute and chronic respiratory failure, unspecified whether with hypoxia or hypercapnia: Secondary | ICD-10-CM | POA: Diagnosis not present

## 2014-02-11 DIAGNOSIS — R0902 Hypoxemia: Secondary | ICD-10-CM | POA: Diagnosis not present

## 2014-02-11 DIAGNOSIS — J9621 Acute and chronic respiratory failure with hypoxia: Secondary | ICD-10-CM

## 2014-02-11 DIAGNOSIS — J441 Chronic obstructive pulmonary disease with (acute) exacerbation: Secondary | ICD-10-CM

## 2014-02-11 DIAGNOSIS — I4891 Unspecified atrial fibrillation: Secondary | ICD-10-CM

## 2014-02-11 DIAGNOSIS — J449 Chronic obstructive pulmonary disease, unspecified: Secondary | ICD-10-CM

## 2014-02-11 LAB — POCT CBC W AUTO DIFF (K'VILLE URGENT CARE)

## 2014-02-11 MED ORDER — IPRATROPIUM-ALBUTEROL 0.5-2.5 (3) MG/3ML IN SOLN: 3.0000 mL | RESPIRATORY_TRACT | Status: DC

## 2014-02-11 MED ORDER — IPRATROPIUM-ALBUTEROL 0.5-2.5 (3) MG/3ML IN SOLN
3.0000 mL | Freq: Once | RESPIRATORY_TRACT | Status: AC
Start: 2014-02-11 — End: 2014-02-11
  Administered 2014-02-11: 3 mL via RESPIRATORY_TRACT

## 2014-02-11 NOTE — Telephone Encounter (Signed)
Call Documentation     Wyline Beadyndrea C Adriyana Greenbaum, CMA at 02/09/2014 4:59 PM     Status: Signed        Called pt and advised him per Dr. Ivan AnchorsHommel via us report that was faxed, pt does not have a blood clot. Pt voiced understanding. Pt did call around 330 today and said he had trouble breathing. Pt was advised by [place] to go to UC to be evaluated. Pt did mention to me that he couldn't breath and that he was told to go to UC but all they would do is tell him he needed oxygen and he has oxygen at home. Pt declined to go to UC            This note was originally written in the wrong pt's chart. I have addended the note in the incorrect pt's chart and now have pasted the phone encounter from 02/09/2014 at 4:59 in this patients chart which is the correct chart. I spoke to our clinical supervisor Selena BattenKim about this and she is aware  and she advised me to correct the error in this way

## 2014-02-11 NOTE — ED Provider Notes (Signed)
CSN: [id number]     Arrival date & time 02/11/14  1333 History   First MD Initiated Contact with Patient 02/11/14 1357     Chief Complaint  Patient presents with  . COPD  . Shortness of Breath    Kenneth Lin is here for COPD exacerbation from URI. Seen by Dr. Ivan AnchorsHommel 3 days ago, placed on azithromycin and prednisone. Despite the azithromycin prednisone, he has had orsening dyspnea today. He states that he normally does not use home oxygen, but felt he needed to use the home oxygen which he brings in today . He is on 2L of 02, sat @ 96%, accessory muscle use.-- Upon presentation to urgent care, patient was immediately taken back to exam room, physician saw him promptly after he was taken back.    .Patient is a 80 y.o. male presenting with shortness of breath. The history is provided by the patient.  Shortness of Breath Severity:  Severe Onset quality:  Gradual Duration:  3 days Progression:  Worsening Chronicity:  Recurrent Context: URI   Relieved by:  Nothing Worsened by:  Activity Ineffective treatments:  Oxygen and position changes (Oxygen 2 L a minute at home helped only minimally) Associated symptoms: cough (Productive of brown sputum), PND, sputum production and wheezing   Associated symptoms: no abdominal pain, no chest pain, no fever, no hemoptysis, no syncope and no vomiting   Risk factors: no hx of PE/DVT     Past Medical History  Diagnosis Date  . COPD (chronic obstructive pulmonary disease)   . Paroxysmal atrial fibrillation     [facility]Henderson [facility].    . Essential hypertension, benign   . Other and unspecified hyperlipidemia   . Peripheral artery disease     Left ABI of leg abnormal.     History reviewed. No pertinent past surgical history. Family History  Problem Relation Age of Onset  . Heart failure Mother   . Cancer Father    History  Substance Use Topics  . Smoking status: Former Games developermoker  . Smokeless tobacco: Never Used  . Alcohol Use: No    Review  of Systems  Constitutional: Positive for appetite change (Decreased) and fatigue. Negative for fever.  HENT: Positive for rhinorrhea.   Eyes: Negative.   Respiratory: Positive for cough (Productive of brown sputum), sputum production, shortness of breath and wheezing. Negative for hemoptysis.   Cardiovascular: Positive for PND. Negative for chest pain, palpitations, leg swelling and syncope.  Gastrointestinal: Negative for nausea, vomiting and abdominal pain.  Genitourinary: Negative.   Neurological: Positive for light-headedness. Negative for syncope.  Psychiatric/Behavioral: The patient is nervous/anxious.       Allergies  Gabapentin  Home Medications   Current Outpatient Rx  Name  Route  Sig  Dispense  Refill  . albuterol (PROVENTIL HFA;VENTOLIN HFA) 108 (90 BASE) MCG/ACT inhaler   Inhalation   Inhale 2 puffs into the lungs every 6 (six) hours as needed for wheezing.   1 Inhaler   6   . aspirin 325 MG tablet   Oral   Take 1 tablet (325 mg total) by mouth daily.   30 tablet   5   . azithromycin (ZITHROMAX) 250 MG tablet      Take two tabs at once on day 1, then one tab daily on days 2-5.   6 each   0   . budesonide-formoterol (SYMBICORT) 160-4.5 MCG/ACT inhaler   Inhalation   Inhale 2 puffs into the lungs 2 (two) times daily.  1 Inhaler   3   . diazepam (VALIUM) 5 MG tablet      TAKE 1 TABLET BY MOUTH TWICE A DAY AS NEEDED FOR ANXIETY   60 tablet   0   . diltiazem (CARDIZEM CD) 180 MG 24 hr capsule   Oral   Take 1 capsule (180 mg total) by mouth daily.   30 capsule   11   . hydrocortisone valerate cream (WESTCORT) 0.2 %      APPLY AS DIRECTED TWICE DAILY   60 g   1   . predniSONE (DELTASONE) 20 MG tablet      Three tabs daily days 1-3, two tabs daily days 4-6, one tab daily days 7-9, half tab daily days 10-13.   20 tablet   0   . SPIRIVA HANDIHALER 18 MCG inhalation capsule      PLACE 1 CAPSULE (18 MCG TOTAL) INTO INHALER AND INHALE DAILY.    30 capsule   5    BP 164/100  Pulse 123  Temp(Src) 97.8 F (36.6 C) (Oral)  Resp 28  SpO2 96% Rechecked O2 saturation, 94-95% on 2 L of oxygen. Physical Exam  Nursing note and vitals reviewed. Constitutional: He is oriented to person, place, and time. He appears well-developed and well-nourished. He is cooperative. He appears toxic. He has a sickly appearance. He appears distressed.  In acute respiratory distress, using accessory muscles  HENT:  Head: Normocephalic and atraumatic.  Right Ear: Tympanic membrane normal.  Left Ear: Tympanic membrane normal.  Nose: Rhinorrhea present.  Mouth/Throat: Oropharynx is clear and moist. No oropharyngeal exudate.  Eyes: Right eye exhibits no discharge. Left eye exhibits no discharge. No scleral icterus.  Neck: Neck supple. No JVD present. No tracheal tenderness present. No tracheal deviation present.  Cardiovascular:  Tachycardic. Distant heart sounds. No definite murmur heard.  Pulmonary/Chest: No respiratory distress. He has decreased breath sounds. He has wheezes. He has rhonchi. He has no rales.  Abdominal: Soft. There is no tenderness.  Lymphadenopathy:    He has no cervical adenopathy.  Neurological: He is alert and oriented to person, place, and time. No cranial nerve deficit.  Skin: Skin is warm. No rash noted. He is diaphoretic.  Psychiatric: His mood appears anxious.    ED Course  Procedures (including critical care time) Labs Review Labs Reviewed  POCT CBC W AUTO DIFF ([facility])   Imaging Review No results found.    MDM   Final diagnoses:  Acute exacerbation of chronic obstructive pulmonary disease (COPD)  Acute on chronic respiratory failure with hypoxia  COPD    DuoNeb given. He still had significant wheezing, decreased breath sounds bilaterally with rhonchi.--O2 saturation 94% on 2 L. Still had respiratory distress, severe dyspnea, and accessory muscle usage EMS called. CBC done at his insistence  after EMS called. WBC 19.7 with 94% granulocytes. Hemoglobin 13.9.  He needs transport to hospital emergency room for further evaluation and treatment of acute exacerbation of COPD with hypoxia. With elevated WBC count with left shift, he likely has pneumonia also the--At first he refused transport to hospital, but after discussing with patient, he allowed EMS to transport him. Patient taken to [facility] ER as his preference hospital.     Lajean Manes, MD 02/11/14 1452

## 2014-02-11 NOTE — ED Notes (Signed)
Kenneth Lin is here for COPD exacerbation from URI. Seen by Dr. Ivan AnchorsHommel 3 days ago, placed on azithromycin and prednisone. SOB started today. He is on 2L of 02, sat @ 96%, accessory muscle use. Dr. Georgina PillionMassey notified.

## 2014-02-13 ENCOUNTER — Telehealth: Payer: Self-pay

## 2014-02-13 NOTE — ED Notes (Signed)
I called and spoke with patient and he is doing better. I advised to call back if anything changes or if he has questions or concerns.  

## 2014-02-14 NOTE — Telephone Encounter (Signed)
Patient called from [facility] and wanted to let Dr. Ivan AnchorsHommel know he is still in the hospital and they diagnosed him with Pneumonia  And also diagnosed his leg pain as Meralgia Paresthetica Rt leg lateral Femural Cutaneous nerve, neuropathy DOES NOT involve spine

## 2014-02-16 ENCOUNTER — Other Ambulatory Visit: Payer: Self-pay | Admitting: Family Medicine

## 2014-02-21 ENCOUNTER — Telehealth: Payer: Self-pay | Admitting: *Deleted

## 2014-02-21 NOTE — Telephone Encounter (Signed)
Pt called up front this am to schedule a hospital f/u. Unfortunately Dr. Ivan Anchorshommel didn't have any available time slots for this patient. [place] up front did try to schedule him an appt for tomorrow but pt declined saying that he needed to be seen today. Pt was then transferred to triage but was unable to get a "live person" so pt called back up front. Per Dr. Ivan AnchorsHommel pt can have his lungs rechecked and [facility] and did advise that it was ok that he go there. Pt states he wants to go to [facility] which [facility] and [facility] need permission to see him.  I did advise him that this an [facility] facility and and they dont need permission to see him he can just show up there. Pt insisted that I call them to let them know it was ok to see him and to call him back. After spending 15 min on the phone with pt I told him that he would be taken care of if he went to [facility] and that if he didn't hear back from me this meant everything was ok.

## 2014-03-03 ENCOUNTER — Ambulatory Visit (INDEPENDENT_AMBULATORY_CARE_PROVIDER_SITE_OTHER): Payer: Medicare Other | Admitting: Family Medicine

## 2014-03-03 ENCOUNTER — Ambulatory Visit (INDEPENDENT_AMBULATORY_CARE_PROVIDER_SITE_OTHER): Payer: Medicare Other

## 2014-03-03 ENCOUNTER — Encounter: Payer: Self-pay | Admitting: Family Medicine

## 2014-03-03 ENCOUNTER — Telehealth: Payer: Self-pay | Admitting: Family Medicine

## 2014-03-03 VITALS — BP 156/85 | HR 117 | Temp 97.5°F | Wt 138.0 lb

## 2014-03-03 DIAGNOSIS — J441 Chronic obstructive pulmonary disease with (acute) exacerbation: Secondary | ICD-10-CM

## 2014-03-03 DIAGNOSIS — J189 Pneumonia, unspecified organism: Secondary | ICD-10-CM

## 2014-03-03 DIAGNOSIS — J4489 Other specified chronic obstructive pulmonary disease: Secondary | ICD-10-CM

## 2014-03-03 DIAGNOSIS — J90 Pleural effusion, not elsewhere classified: Secondary | ICD-10-CM

## 2014-03-03 DIAGNOSIS — K5900 Constipation, unspecified: Secondary | ICD-10-CM

## 2014-03-03 DIAGNOSIS — J449 Chronic obstructive pulmonary disease, unspecified: Secondary | ICD-10-CM

## 2014-03-03 DIAGNOSIS — G5710 Meralgia paresthetica, unspecified lower limb: Secondary | ICD-10-CM | POA: Insufficient documentation

## 2014-03-03 DIAGNOSIS — R918 Other nonspecific abnormal finding of lung field: Secondary | ICD-10-CM

## 2014-03-03 LAB — CBC WITH DIFFERENTIAL/PLATELET
BASOS ABS: 0.1 10*3/uL (ref 0.0–0.1)
Basophils Relative: 1 % (ref 0–1)
EOS PCT: 2 % (ref 0–5)
Eosinophils Absolute: 0.1 10*3/uL (ref 0.0–0.7)
HEMATOCRIT: 39.3 % (ref 39.0–52.0)
Hemoglobin: 13.5 g/dL (ref 13.0–17.0)
LYMPHS PCT: 8 % — AB (ref 12–46)
Lymphs Abs: 0.5 10*3/uL — ABNORMAL LOW (ref 0.7–4.0)
MCH: 28.5 pg (ref 26.0–34.0)
MCHC: 34.4 g/dL (ref 30.0–36.0)
MCV: 82.9 fL (ref 78.0–100.0)
Monocytes Absolute: 0.5 10*3/uL (ref 0.1–1.0)
Monocytes Relative: 8 % (ref 3–12)
Neutro Abs: 5.3 10*3/uL (ref 1.7–7.7)
Neutrophils Relative %: 81 % — ABNORMAL HIGH (ref 43–77)
Platelets: 178 10*3/uL (ref 150–400)
RBC: 4.74 MIL/uL (ref 4.22–5.81)
RDW: 15.7 % — AB (ref 11.5–15.5)
WBC: 6.6 10*3/uL (ref 4.0–10.5)

## 2014-03-03 NOTE — Progress Notes (Signed)
CC: Kenneth Lin is a 80 y.o. male is here for f/u pneumonia   Subjective: HPI:  Followup community acquired pneumonia: Was admitted February 13 for 5 days for left lobar pneumonia treated with levofloxacin /azithromycin and COPD treated with Solu-Medrol followed by outpatient prednisone. Since discharge patient states that he is breathing back to his normal baseline he checks his pulse oximetry frequently throughout the day at rest and with exertion it has never dipped below 96 since discharge. He was given 10 days of levofloxacin which he took as prescribed 2 days after stopping it he became anxious that the pneumonia was returning was seen at a local urgent care office where his white blood cell count had risen by 1 point since discharge and his pneumonia had not fully resolved with respect to a chest x-ray. Due to the patient's high concern that his pneumonia was returning despite symptoms joint decision was made between him and the attending physician to extend levofloxacin for 5 days. He has finished that as of yesterday continues to state that he is feeling well other than constipation. Denies cough, shortness of breath, wheezing, chest discomfort nor confusion. Continues on Simbicort and Spiriva.    While hospitalized he was given a prescription for Tegretol for treatment of right meralgia paresthetica which has improved the symptoms of pain in his right hip/thigh however he believes is causing constipation. He has done some research and indeed this medication is known to cause constipation. He reports he has had one bowel movement since discharge.  He has tried Colace but no other laxatives. He was given advice to start magnesium citrate however the package insert for levofloxacin told him not to take any magnesium containing products with this medication. Denies decreased appetite, nausea, vomiting, nor abdominal pain or back pain.   Review Of Systems Outlined In HPI  Past Medical History   Diagnosis Date  . COPD (chronic obstructive pulmonary disease)   . Paroxysmal atrial fibrillation     See[facility] [name] at [facility].    . Essential hypertension, benign   . Other and unspecified hyperlipidemia   . Peripheral artery disease     Left ABI of leg abnormal.      No past surgical history on file. Family History  Problem Relation Age of Onset  . Heart failure Mother   . Cancer Father     History   Social History  . Marital Status: Married    Spouse Name: N/A    Number of Children: 2  . 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 Smoking status: Former Games developer  . Smokeless tobacco: Never Used  . Alcohol Use: No  . Drug Use: No  . Sexual Activity: Not on file   Other Topics Concern  . Not on file   Social History Narrative  . No narrative on file     Objective: BP 156/85  Pulse 117  Temp(Src) 97.5 F (36.4 C) (Oral)  Wt 138 lb (62.596 kg)  SpO2 99%  General: Alert and Oriented, No Acute Distress HEENT: Pupils equal, round, reactive to light. Conjunctivae clear.  Moist mucous membranes pharynx unremarkable Lungs: Distant breath sounds however Clear to auscultation bilaterally, no wheezing/ronchi/rales.  Comfortable work of breathing. Good air movement. Cardiac: Regular rate and rhythm. Normal S1/S2.  No murmurs, rubs, nor gallops.   Abdomen: Soft flat nontender Extremities: No peripheral edema.  Strong peripheral pulses.  Mental Status:  No depression, anxiety, nor agitation. Skin: Warm and dry.  Assessment & Plan: Kenneth Lin was seen today for f/u pneumonia.  Diagnoses and associated orders for this visit:  COPD  CAP (community acquired pneumonia) - CBC w/Diff - DG Chest 2 View; Future  COPD exacerbation  Meralgia paraesthetica  Unspecified constipation    COPD: Controlled continue maintenance inhalers CAP: Symptomatically controlled given his degree of anxiety (he has already  scheduled appointment with me tomorrow in the morning in case any respiratory symptoms return) we will obtain an x-ray and white blood cell count especially now that he has been off of prednisone for over a week. Reassurance provided there is no clinical evidence of return of pneumonia on his exam today and based on his history Constipation: Likely due to a combination of Tegretol and inactivity while hospitalized. I've encouraged him to use docusate sodium as needed and magnesium citrate as previously recommended.  40 minutes spent face-to-face during visit today of which at least 50% was counseling or coordinating care regarding: 1. COPD   2. CAP (community acquired pneumonia)   3. COPD exacerbation   4. Meralgia paraesthetica   5. Unspecified constipation       Return if symptoms worsen or fail to improve.

## 2014-03-03 NOTE — Telephone Encounter (Signed)
Pt.notified

## 2014-03-03 NOTE — Telephone Encounter (Signed)
[name], Will you please let Mr. Kenneth Lin know that his Xray shows some scar tissue where his pneumonia was but I don't see any active infection visually, advice on medication will be dependent on whether or not his WBC is elevated, we'll keep him updated.

## 2014-03-04 ENCOUNTER — Ambulatory Visit: Payer: Medicare Other | Admitting: Family Medicine

## 2014-03-08 ENCOUNTER — Encounter: Payer: Self-pay | Admitting: Family Medicine

## 2014-03-10 ENCOUNTER — Ambulatory Visit (INDEPENDENT_AMBULATORY_CARE_PROVIDER_SITE_OTHER): Payer: Medicare Other | Admitting: Family Medicine

## 2014-03-10 ENCOUNTER — Encounter: Payer: Self-pay | Admitting: Family Medicine

## 2014-03-10 VITALS — BP 171/104 | HR 95 | Temp 97.6°F | Wt 137.0 lb

## 2014-03-10 DIAGNOSIS — T887XXA Unspecified adverse effect of drug or medicament, initial encounter: Secondary | ICD-10-CM

## 2014-03-10 DIAGNOSIS — J441 Chronic obstructive pulmonary disease with (acute) exacerbation: Secondary | ICD-10-CM

## 2014-03-10 MED ORDER — AMOXICILLIN-POT CLAVULANATE 500-125 MG PO TABS: ORAL_TABLET | ORAL | Status: AC

## 2014-03-10 NOTE — Progress Notes (Signed)
CC: Kenneth Lin is a 80 y.o. male is here for still feeling weak and fatigued   Subjective: HPI:  Patient presents due to concerns of weakness and fatigue that has been present ever since he left the hospital for COPD exacerbation/pneumonia last month. He states that since discharge he has had difficulty with shortness of breath when walking around the house and weakness in both legs when climbing the stairs. Symptoms are moderate in severity and has not been getting better by any degree. Interventions have included increasing juice in his diet and trying to increase his daily exercise.  He has been checking his pulse when walking it is ranging between the 120s to 100, pulse at rest has been ranging between 90-100. He denies chest pain, rapid heartbeat, confusion nor lightheadedness.  He has done some research and points out that diltiazem can increase the effectiveness/side effects of Tegretol and that Tegretol often can cause fatigue, and weakness.  He continues to have constipation but it is 100% resolved if he takes Colace on an as-needed basis.  He tells that he has been short of breath for the past one to 2 weeks however pulse oximetry at home has consistently been in the high 90s. Nothing makes symptoms better or worse he continues on both of his inhalers rarely using albuterol. He does report increased clear sputum production over the past 2 days. Denies fevers, chills, confusion, orthopnea nor peripheral edema  He has a list of side effects from his pharmacy for all the medications that he is on that he would like to go over in detail today  Review Of Systems Outlined In HPI  Past Medical History  Diagnosis Date  . COPD (chronic obstructive pulmonary disease)   . Paroxysmal atrial fibrillation     Seeing [name] at [facility].    . Essential hypertension, benign   . Other and unspecified hyperlipidemia   . Peripheral artery disease     Left ABI of leg abnormal.      No past  surgical history on file. Family History  Problem Relation Age of Onset  . Heart failure Mother   . Cancer Father     History   Social History  . Marital Status: Married    Spouse Name: N/A    Number of Children: 2  . 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 Smoking status: Former Games developer  . Smokeless tobacco: Never Used  . Alcohol Use: No  . Drug Use: No  . Sexual Activity: Not on file   Other Topics Concern  . Not on file   Social History Narrative  . No narrative on file     Objective: BP 171/104  Pulse 95  Temp(Src) 97.6 F (36.4 C) (Oral)  Wt 137 lb (62.143 kg)  SpO2 99%  General: Alert and Oriented, No Acute Distress HEENT: Pupils equal, round, reactive to light. Conjunctivae clear.  External ears unremarkable, canals clear with intact TMs with appropriate landmarks.  Middle ear appears open without effusion. Pink inferior turbinates.  Moist mucous membranes, pharynx without inflammation nor lesions.  Neck supple without palpable lymphadenopathy nor abnormal masses. Lungs: Comfortable work of breathing, distant breath sounds, mild rhonchi in both lower lung lobes without rales or wheezing. Cardiac: Irregular irregular heartbeat without murmurs rubs or gallops Extremities: No peripheral edema.  Strong peripheral pulses.  Mental Status: No depression, anxiety, nor agitation. Skin: Warm and dry.  Assessment & Plan: Kenneth Lin was seen today for still feeling weak and fatigued.  Diagnoses and associated orders for this visit:  COPD exacerbation - amoxicillin-clavulanate (AUGMENTIN) 500-125 MG per tablet; Take one by mouth every 8 hours for ten total days.  Medication side effect    Medication side effect: Discussed with patient that differential of his fatigue and weakness certainly includes Tegretol, given interaction with diltiazem we made a joint decision to cut back on Tegretol to a total of only  100 mg daily.  Additionally discussed that his blood pressure and heart rate could be contributing to his fatigue and that by going up on diltiazem we may be able to better rate control his atrial fibrillation along with lowering his blood pressure. He would prefer to stick to one intervention at the time that being Tegretol adjustment only, I've asked him to return 1 week. We went over potential side effects for all of his medications, allowed time for all of his questions to be answered COPD exacerbation: Start Augmentin, avoiding Biaxin or azithromycin due to potential for QT prolongation. No current indication for steroids    40 minutes spent face-to-face during visit today of which at least 50% was counseling or coordinating care regarding: 1. COPD exacerbation   2. Medication side effect      Return in about 1 week (around 03/17/2014).

## 2014-03-14 ENCOUNTER — Telehealth: Payer: Self-pay | Admitting: *Deleted

## 2014-03-14 ENCOUNTER — Other Ambulatory Visit: Payer: Self-pay | Admitting: Family Medicine

## 2014-03-14 MED ORDER — NYSTATIN 100000 UNIT/ML MT SUSP: 5.0000 mL | Freq: Four times a day (QID) | OROMUCOSAL | Status: DC

## 2014-03-14 NOTE — Telephone Encounter (Signed)
Pt.notified

## 2014-03-14 NOTE — Telephone Encounter (Signed)
[facility]

## 2014-03-14 NOTE — Telephone Encounter (Signed)
Patient called and states that he needs a Auth for [place]Nyastatin [facility] [address] [facility] [place], Patient was prescribed this med upon D/C from hosp

## 2014-03-14 NOTE — Telephone Encounter (Signed)
Pt would like a rx for nystatin for thrush in mouth. Pt states he was rx'ed this while in the pharmacy

## 2014-03-15 ENCOUNTER — Telehealth: Payer: Self-pay | Admitting: Family Medicine

## 2014-03-15 ENCOUNTER — Other Ambulatory Visit: Payer: Self-pay | Admitting: Family Medicine

## 2014-03-15 MED ORDER — PREGABALIN 25 MG PO CAPS: 25.0000 mg | ORAL_CAPSULE | Freq: Two times a day (BID) | ORAL | Status: DC

## 2014-03-15 NOTE — Telephone Encounter (Signed)
Pt stopped taking tegretol, still experiencing superficial hip discomfort, per neurology recs at [facility] I'll prescribe low dose lyrica.  [name] this was printed and placed in your inbox [facility] [address]?  Meralgia paraesthetica

## 2014-03-15 NOTE — Telephone Encounter (Signed)
Pt called and spoke with tammy and apparently has reservations about taking lyrica. After tammy spent about 20 min on the phone with pt he was scheduled for an appt. Will hold off on faxing the rx at this time

## 2014-03-16 ENCOUNTER — Telehealth: Payer: Self-pay | Admitting: *Deleted

## 2014-03-16 ENCOUNTER — Ambulatory Visit (INDEPENDENT_AMBULATORY_CARE_PROVIDER_SITE_OTHER): Payer: Medicare Other

## 2014-03-16 ENCOUNTER — Telehealth: Payer: Self-pay | Admitting: Family Medicine

## 2014-03-16 ENCOUNTER — Encounter: Payer: Self-pay | Admitting: Family Medicine

## 2014-03-16 ENCOUNTER — Ambulatory Visit (INDEPENDENT_AMBULATORY_CARE_PROVIDER_SITE_OTHER): Payer: Medicare Other | Admitting: Family Medicine

## 2014-03-16 VITALS — BP 154/89 | HR 98 | Ht 70.0 in | Wt 140.0 lb

## 2014-03-16 DIAGNOSIS — R05 Cough: Secondary | ICD-10-CM

## 2014-03-16 DIAGNOSIS — J438 Other emphysema: Secondary | ICD-10-CM

## 2014-03-16 DIAGNOSIS — F411 Generalized anxiety disorder: Secondary | ICD-10-CM

## 2014-03-16 DIAGNOSIS — F419 Anxiety disorder, unspecified: Secondary | ICD-10-CM

## 2014-03-16 DIAGNOSIS — G5710 Meralgia paresthetica, unspecified lower limb: Secondary | ICD-10-CM

## 2014-03-16 DIAGNOSIS — R059 Cough, unspecified: Secondary | ICD-10-CM

## 2014-03-16 DIAGNOSIS — J189 Pneumonia, unspecified organism: Secondary | ICD-10-CM

## 2014-03-16 DIAGNOSIS — R918 Other nonspecific abnormal finding of lung field: Secondary | ICD-10-CM

## 2014-03-16 DIAGNOSIS — F19939 Other psychoactive substance use, unspecified with withdrawal, unspecified: Secondary | ICD-10-CM

## 2014-03-16 MED ORDER — DIAZEPAM 5 MG PO TABS: ORAL_TABLET | ORAL | Status: DC

## 2014-03-16 MED ORDER — MOXIFLOXACIN HCL 400 MG PO TABS: 400.0000 mg | ORAL_TABLET | Freq: Every day | ORAL | Status: DC

## 2014-03-16 NOTE — Telephone Encounter (Signed)
Pt.notified

## 2014-03-16 NOTE — Progress Notes (Signed)
CC: Kenneth Lin is a 80 y.o. male is here for Medication Problem   Subjective: HPI:  Patient stopped taking Tegretol on Saturday within 24 hours after his last dose he began to notice anxiety, tremor, and lightheadedness that worsened until Monday and has been persistent since today. It is present during all hours of the day. Slightly improved after taking his usual dose of Valium twice a day. Nothing else has made better or worse. He's never had this before. He denies any other changes to his medication regimen. Denies motor or sensory disturbances other than that described above and by complaints below. He reports shortness of breath mild severity with increased sputum production and more frequent cough and baseline but denies fevers, chills, confusion, chest pain, nor orthopnea.  He resides a lengthy history of being admitted for benzodiazepines and gabapentin withdrawal in 2013 and feels that symptoms are similar to that episode.  He continues to have right superficial anterior lateral numbness with a burning sensation it has not gotten better or worse since stopping Tegretol. It is mild to moderate in severity and interferes with his quality of life. He denies any new hip pain or motor or sensory disturbances.   Review Of Systems Outlined In HPI  Past Medical History  Diagnosis Date  . COPD (chronic obstructive pulmonary disease)   . Paroxysmal atrial fibrillation     Seeing [name] at [facility].    . Essential hypertension, benign   . Other and unspecified hyperlipidemia   . Peripheral artery disease     Left ABI of leg abnormal.      No past surgical history on file. Family History  Problem Relation Age of Onset  . Heart failure Mother   . Cancer Father     History   Social History  . Marital Status: Married    Spouse Name: N/A    Number of Children: 2  . 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 Smoking  status: Former Games developer  . Smokeless tobacco: Never Used  . Alcohol Use: No  . Drug Use: No  . Sexual Activity: Not on file   Other Topics Concern  . Not on file   Social History Narrative  . No narrative on file     Objective: BP 154/89  Pulse 98  Ht 5\' 10"  (1.778 m)  Wt 140 lb (63.504 kg)  BMI 20.09 kg/m2  General: Alert and Oriented, No Acute Distress HEENT: Pupils equal, round, reactive to light. Conjunctivae clear.  External ears unremarkable, canals clear with intact TMs with appropriate landmarks.  Middle ear appears open without effusion. Pink inferior turbinates.  Moist mucous membranes, pharynx without inflammation nor lesions.  Neck supple without palpable lymphadenopathy nor abnormal masses. Lungs: Distant breath sounds however Clear to auscultation bilaterally, no wheezing/ronchi/rales.  Comfortable work of breathing. Good air movement. Cardiac: Irregular irregular rhythm below 100 beats per minute. Normal S1/S2.  No murmurs, rubs, nor gallops.   Extremities: No peripheral edema.  Strong peripheral pulses. Mild intention tremor in both hands Mental Status: No depression, anxiety, nor agitation. Skin: Warm and dry.  Assessment & Plan: Kenneth Lin was seen today for medication problem.  Diagnoses and associated orders for this visit:  Cough - DG Chest 2 View; Future  Anxiety - diazepam (VALIUM) 5 MG tablet; TAKE 1-2 TABLET TWO TO THREE TIMES A DAY AS NEEDED, TO LAST ONE MONTH  Meralgia paraesthetica  Medication withdrawal    Cough: Chest x-ray was obtained with no improvement from his films from earlier this month, due to fears of returning pneumonia we'll start doxy floxacillin for the next week to take in addition to his already prescribed Augmentin Medication withdrawal: Suspect his abrupt Tegretol withdrawal is contributed to worsening anxiety and tremor. He's requesting that he restart Ativan taper that he was given 2013. I've recommended that he only increases  his diltiazem over the next 3-4 days taking one-2 tabs 2-3 times a day as needed. Meralgia paresthetica: He is refusing Lyrica. I've given him the name of amitriptyline which was recommended by [facility] neurology in February for him to do research on. When he is no longer experiencing withdrawal from Tegretol we can consider starting amitriptyline  40 minutes spent face-to-face during visit today of which at least 50% was counseling or coordinating care regarding: 1. Cough   2. Anxiety   3. Meralgia paraesthetica   4. Medication withdrawal       Return in about 4 weeks (around 04/13/2014).

## 2014-03-16 NOTE — Telephone Encounter (Signed)
[name], Will you please let Mr. Kenneth Lin know that his Kenneth AbrahamXray shows that his pneumonia from last month has not fully cleared, I'd advise him to start an antibiotic called moxifloxacin in addition to his amoxicillin, I sent this to his pharmacy just now.

## 2014-03-16 NOTE — Telephone Encounter (Signed)
[name], Will you please relay that I'd recommend he follow up with his pulmonologist next week even if he's feeling better from a respiratory standpoint.  Dr. Claudie Fishermanhin might want to do further testing to see why he hasn't completely cleared this.

## 2014-03-16 NOTE — Telephone Encounter (Signed)
Pt wanted to know why the pneumonia was not cleared with the first round of abx and what type of bacteria is causing his pnuemonia. i told him that I wasn't sure why it didn't clear up the first time but sometimes additional abx are required to completely get rid of the infection. I told him that I wasn't sure how they would find out specifically if and what  bacteria was causing the infection other than doing a culture of some sort. I advised him to complete the course of both abx and to f/u if no improvement. Pt wanted me to forward this phone note to Dr. Ivan AnchorsHommel

## 2014-03-17 ENCOUNTER — Telehealth: Payer: Self-pay | Admitting: *Deleted

## 2014-03-17 ENCOUNTER — Telehealth: Payer: Self-pay | Admitting: Family Medicine

## 2014-03-17 NOTE — Telephone Encounter (Signed)
Pt.notified

## 2014-03-17 NOTE — Telephone Encounter (Signed)
-  No evidence of bronchitis on Xray -After looking at all meds on his medication list he should not stop taking any of his medications with his new moxifloxacin antibiotic. If he has specific questions beyond this he needs his usual 30 minute appointment.

## 2014-03-17 NOTE — Telephone Encounter (Signed)
Pt wanted you to know that he went to [facility] last on and had his white blood cell count checked and it was 6.3

## 2014-03-17 NOTE — Telephone Encounter (Signed)
Pt notified.see phone note Victorino DikeJennifer sent.

## 2014-03-17 NOTE — Telephone Encounter (Signed)
Patient called and concerned with his xray results and med questions. He just needs to talk to Dr.Hommel to find out IF xray results showed him still having Bronchitis as well, he also has a questions about the new med side effects and also wants to know if he should quite taking a CERTAIN med with his new med? Please call patient as soon as you get a chance. Thanks, DIRECTVJennifer

## 2014-03-18 ENCOUNTER — Ambulatory Visit: Payer: Medicare Other | Admitting: Family Medicine

## 2014-04-18 ENCOUNTER — Other Ambulatory Visit: Payer: Self-pay | Admitting: Family Medicine

## 2014-05-03 ENCOUNTER — Telehealth: Payer: Self-pay | Admitting: Family Medicine

## 2014-05-03 NOTE — Telephone Encounter (Signed)
Patient called request to have nurse call him back today before 5pm if possible. Patient request a call back on [phone]. Thanks

## 2014-05-04 ENCOUNTER — Telehealth: Payer: Self-pay | Admitting: *Deleted

## 2014-05-04 NOTE — Telephone Encounter (Signed)
Pt wants to know if he should take the diazepam as it is written on the bottle or if he should just take it twice a day. The directions written the last time it was rx'ed he says was to help him with withdrawal sxs from another med

## 2014-05-04 NOTE — Telephone Encounter (Signed)
Diazepam approved until 04/19/2015. Pharmacy informed.  Meyer CoryMisty Ahmad, LPN

## 2014-05-05 ENCOUNTER — Encounter: Payer: Self-pay | Admitting: Family Medicine

## 2014-05-05 ENCOUNTER — Ambulatory Visit (INDEPENDENT_AMBULATORY_CARE_PROVIDER_SITE_OTHER): Payer: Medicare Other | Admitting: Family Medicine

## 2014-05-05 VITALS — BP 178/100 | HR 86 | Wt 147.0 lb

## 2014-05-05 DIAGNOSIS — G5710 Meralgia paresthetica, unspecified lower limb: Secondary | ICD-10-CM

## 2014-05-05 DIAGNOSIS — F419 Anxiety disorder, unspecified: Secondary | ICD-10-CM

## 2014-05-05 DIAGNOSIS — G4700 Insomnia, unspecified: Secondary | ICD-10-CM

## 2014-05-05 DIAGNOSIS — R29818 Other symptoms and signs involving the nervous system: Secondary | ICD-10-CM

## 2014-05-05 DIAGNOSIS — F411 Generalized anxiety disorder: Secondary | ICD-10-CM

## 2014-05-05 DIAGNOSIS — R29898 Other symptoms and signs involving the musculoskeletal system: Secondary | ICD-10-CM

## 2014-05-05 MED ORDER — ZOLPIDEM TARTRATE 5 MG PO TABS: 5.0000 mg | ORAL_TABLET | Freq: Every evening | ORAL | Status: DC | PRN

## 2014-05-05 MED ORDER — DIAZEPAM 5 MG PO TABS: 5.0000 mg | ORAL_TABLET | Freq: Two times a day (BID) | ORAL | Status: DC | PRN

## 2014-05-05 NOTE — Progress Notes (Signed)
CC: Kenneth ModenaRonald Lin is a 80 y.o. male is here for trouble sleeping   Subjective: HPI:  Complains of bilateral leg pain localized to the anterior thighs, moderate in severity worse with initial activity after periods of inactivity. It improves with walking or any other activity. Denies rest pain. Has been present for the past 2-3 months, ever since discharge from hospital. Symptoms are moderate in severity improving slightly on a weekly basis. Accompanied by hip pain localized over the greater trochanters bilaterally which also improves with walking but is worse after periods of inactivity. Denies any overlying skin changes redness, swelling, nor warmth at the site of discomfort. Pain is nonradiating. No interventions as of yet. Pain is described as a pain and soreness.  Meralgia paraesthetica: Symptoms have 100% resolved as of one week after I saw him last. He is no longer having irritability, tremor, nor lightheadedness that he was experiencing after stopping Tegretol.  Patient reports continued chronic anxiety that is well-controlled on taking 5 mg of Valium twice a day. When I saw him last we had increased his Valium and an as-needed basis for treatment of Tegretol withdrawal symptoms. When he had this refilled last the instructions still reflected that he can take up to 6 tablets a day and he expresses confusion on what he should actually be taking at this point. He is back to taking it twice a day with anxiety not interfere with quality of life.  Patient complains of difficulty falling asleep and staying asleep on a daily basis mild to moderate in severity. He wants to know for some something that he can take other than Valium to help him with sleep.  He specifically expresses interest in Ambien. Nothing particularly causes his difficulty with insomnia that he can identify, he does not believe the pain keeps him awake, anxiety keeps him awake, nor mental disturbance keeps him awake.   Review Of  Systems Outlined In HPI  Past Medical History  Diagnosis Date  . COPD (chronic obstructive pulmonary disease)   . Paroxysmal atrial fibrillation     [facility]Henderson [facility].    . Essential hypertension, benign   . Other and unspecified hyperlipidemia   . Peripheral artery disease     Left ABI of leg abnormal.      No past surgical history on file. Family History  Problem Relation Age of Onset  . Heart failure Mother   . Cancer Father     History   Social History  . Marital Status: Married    Spouse Name: N/A    Number of Children: 2  . 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 Smoking status: Former Games developermoker  . Smokeless tobacco: Never Used  . Alcohol Use: No  . Drug Use: No  . Sexual Activity: Not on file   Other Topics Concern  . Not on file   Social History Narrative  . No narrative on file     Objective: BP 178/100  Pulse 86  Wt 147 lb (66.679 kg)  General: Alert and Oriented, No Acute Distress HEENT: Pupils equal, round, reactive to light. Conjunctivae clear. Moist mucous membranes pharynx unremarkable Lungs: Clear comfortable breathing Cardiac: Normal S1/S2.  No murmurs, rubs, nor gallops.   Abdomen: Soft nontender Extremities: No peripheral edema.  Strong peripheral pulses. Full range of motion and strength of both lower extremities. Hip pain is reproduced with palpation of either greater  trochanters.  He has a waddling gait and minimal muscle on both left and right quadriceps. Mental Status: No depression, anxiety, nor agitation. Skin: Warm and dry.  Assessment & Plan: Kenneth Lin was seen today for trouble sleeping.  Diagnoses and associated orders for this visit:  Insomnia - zolpidem (AMBIEN) 5 MG tablet; Take 1 tablet (5 mg total) by mouth at bedtime as needed for sleep.  Muscular deconditioning - Ambulatory referral to Physical Therapy  Anxiety  Meralgia paraesthetica  Other  Orders - diazepam (VALIUM) 5 MG tablet; Take 1 tablet (5 mg total) by mouth every 12 (twelve) hours as needed for anxiety.    Insomnia: Discussed risks and benefits of using low-dose Ambien including but not limited to fall risk and sleepwalking. He would like to try this medication. Muscular deconditioning: I believe he would strongly benefit from physical therapy should help with deconditioning but also with greater trochanteric bursitis Meralgia paraesthetica: Resolved Anxiety: Controlled on 5 mg Valium twice a day, remain at this dosage and frequency.  40 minutes spent face-to-face during visit today of which at least 50% was counseling or coordinating care regarding: 1. Insomnia   2. Muscular deconditioning   3. Anxiety   4. Meralgia paraesthetica      Return in about 3 months (around 08/05/2014).

## 2014-06-02 IMAGING — CR DG CHEST 2V
2 series · 2 of 2 positions shown · non-contrast
Comparison: 06/29/2011. No more recent studies are available for
comparison.

CLINICAL DATA: Follow-up left lower lobe pneumonia

EXAM:
CHEST  2 VIEW

[view not recorded (1 of 2)]
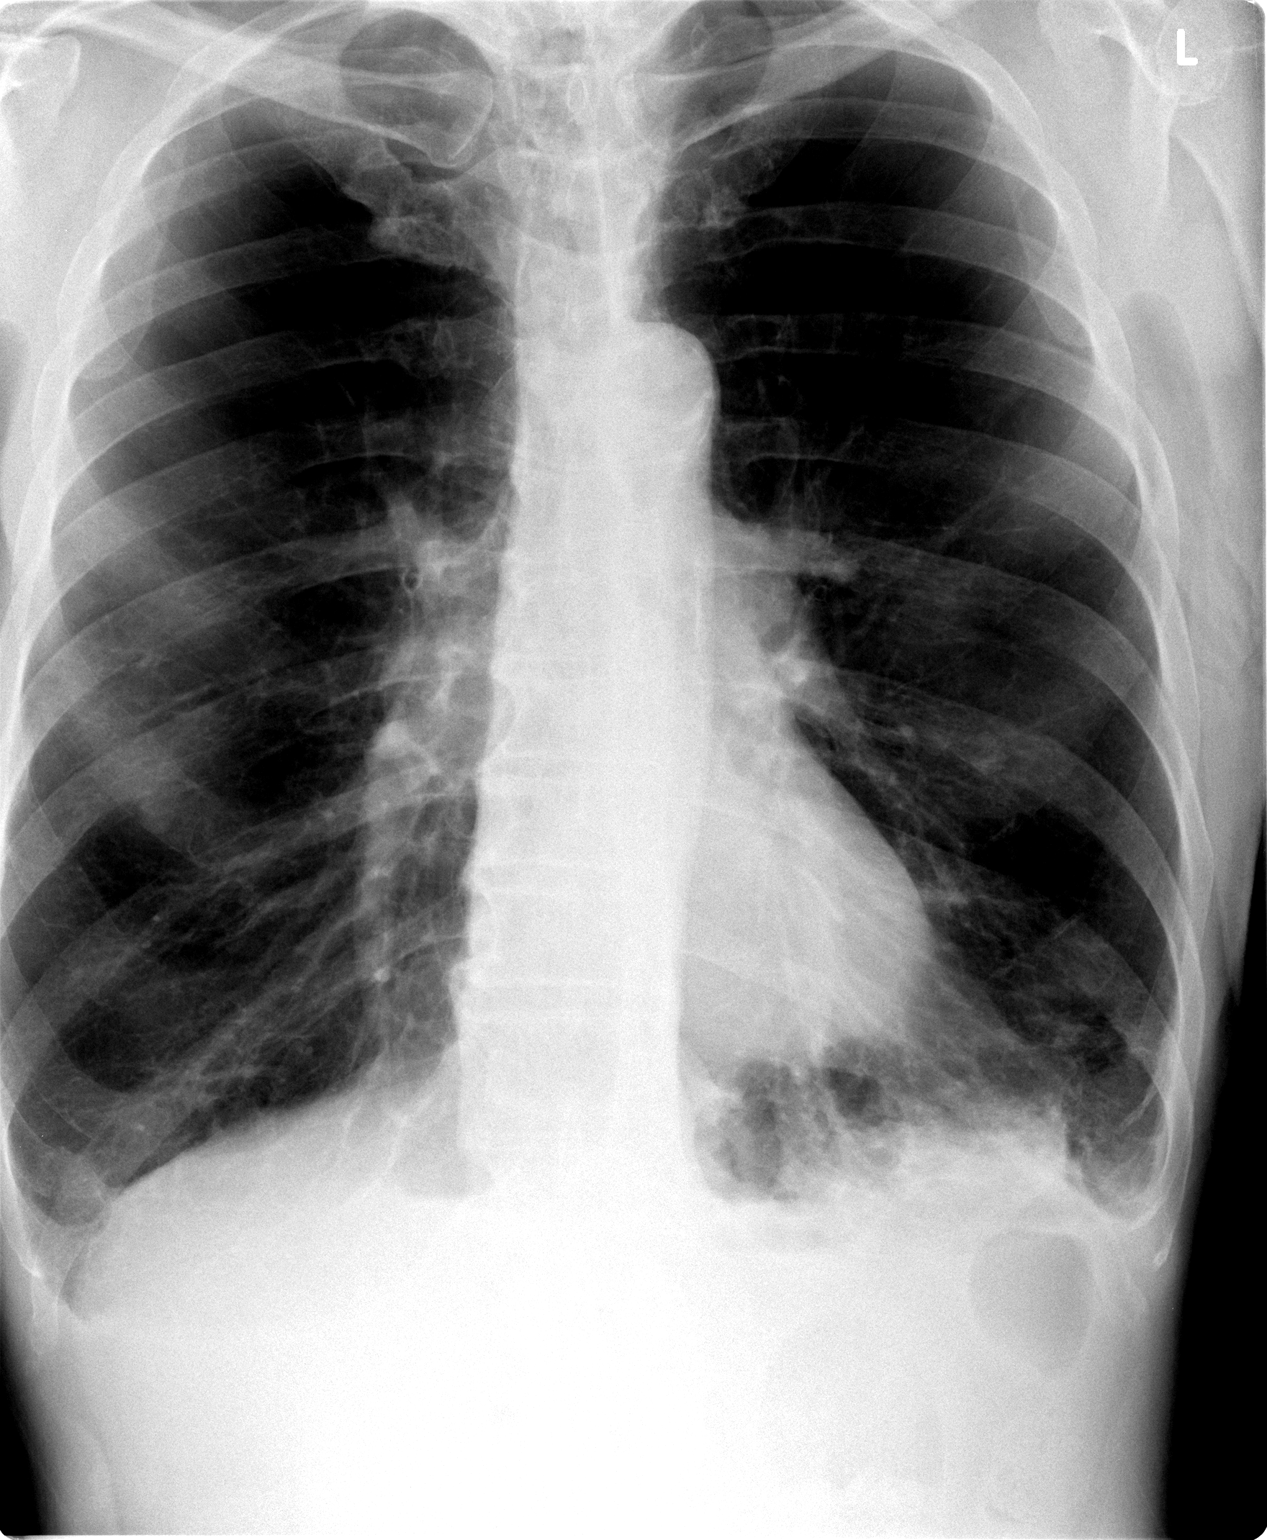

[view not recorded (2 of 2)]
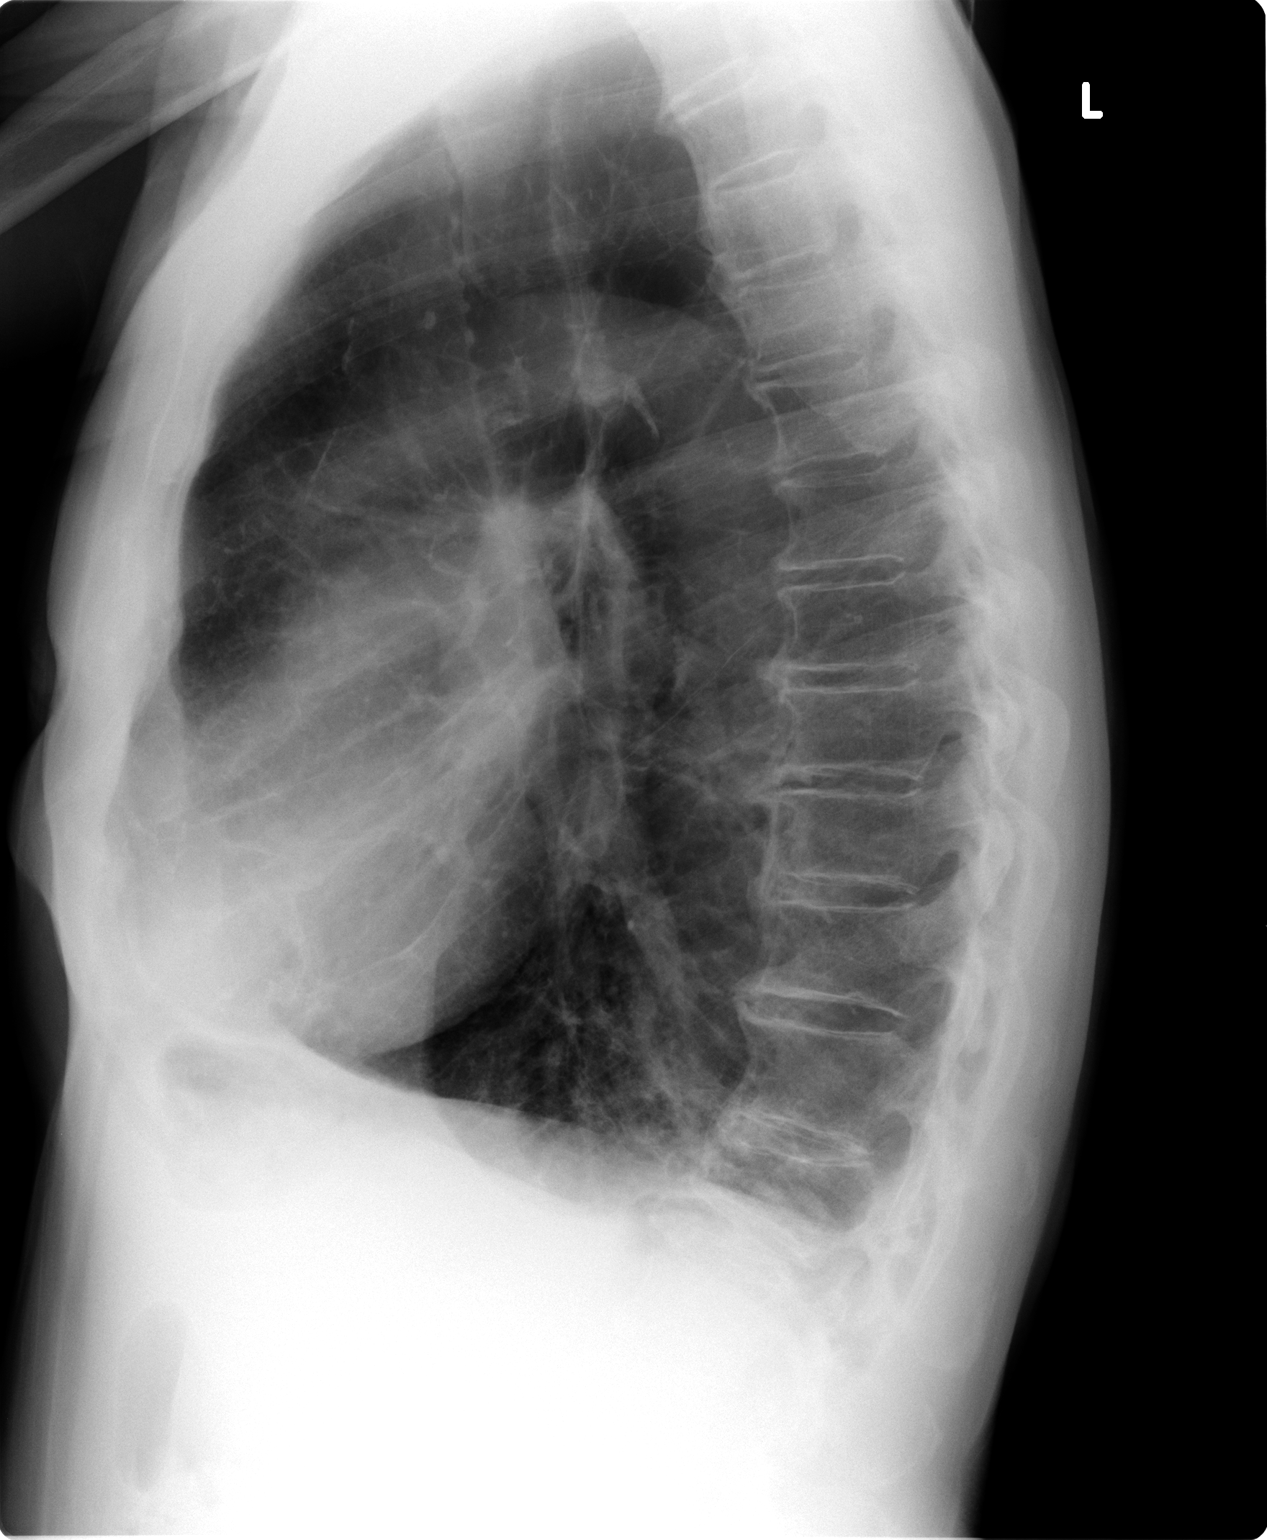

[2 of 2 positions shown; findings below may reference images not displayed]

FINDINGS: COPD with hyperinflation.  Changes of emphysema.

Left lower lobe infiltrate is present. This could be acute or
chronic. There was some scarring in the left lung base on the prior
study but today's study shows slight increase in density in the left
lung base suggesting superimposed pneumonia. Minimal left effusion.

Right lung is clear.  Negative for heart failure.
IMPRESSION: Advanced COPD.

Mild left lower lobe infiltrate and effusion suggests of pneumonia.

## 2014-06-05 ENCOUNTER — Other Ambulatory Visit: Payer: Self-pay | Admitting: Family Medicine

## 2014-06-06 ENCOUNTER — Other Ambulatory Visit: Payer: Self-pay | Admitting: Family Medicine

## 2014-06-13 ENCOUNTER — Ambulatory Visit (INDEPENDENT_AMBULATORY_CARE_PROVIDER_SITE_OTHER): Payer: Medicare Other | Admitting: Family Medicine

## 2014-06-13 ENCOUNTER — Encounter: Payer: Self-pay | Admitting: Family Medicine

## 2014-06-13 VITALS — BP 144/89 | HR 100 | Wt 146.0 lb

## 2014-06-13 DIAGNOSIS — J449 Chronic obstructive pulmonary disease, unspecified: Secondary | ICD-10-CM

## 2014-06-13 DIAGNOSIS — B3749 Other urogenital candidiasis: Secondary | ICD-10-CM

## 2014-06-13 DIAGNOSIS — B3742 Candidal balanitis: Secondary | ICD-10-CM

## 2014-06-13 DIAGNOSIS — I4891 Unspecified atrial fibrillation: Secondary | ICD-10-CM

## 2014-06-13 DIAGNOSIS — I480 Paroxysmal atrial fibrillation: Secondary | ICD-10-CM

## 2014-06-13 DIAGNOSIS — I10 Essential (primary) hypertension: Secondary | ICD-10-CM

## 2014-06-13 MED ORDER — CLOTRIMAZOLE-BETAMETHASONE 1-0.05 % EX CREA: TOPICAL_CREAM | CUTANEOUS | Status: AC

## 2014-06-13 MED ORDER — DILTIAZEM HCL 30 MG PO TABS: 30.0000 mg | ORAL_TABLET | Freq: Four times a day (QID) | ORAL | Status: DC | PRN

## 2014-06-13 NOTE — Progress Notes (Signed)
CC: Kenneth Lin is a 80 y.o. male is here for high pulse   Subjective: HPI:  Complains of a rapid heartbeat has been present for the past 24 hours described as pulse rate being as high as 105 while resting, no higher if he is up walking around. Pulse rate will fluctuate down into the 80s once resting. Symptoms came on gradually starting yesterday and have not been accompanied by any subjective complaints such as chest pain, shortness of breath, orthopnea, peripheral edema, motor or sensory disturbances, nor dizziness. It was noticed during routine blood pressure check at home. He admits that he stop taking diltiazem 3 days ago to see if he still needed to be on it. He did some research after finding his pulse rate elevated and did not know that diltiazem is being used to rate control his heart rate. He restarted his usual dose of diltiazem last night and has only noticed a mild improvement of his tachycardia.  Reports that blood pressures at home have been consistently below 140/90 on or off of diltiazem  Complains of pain on the penis that has been present for the past month on a daily basis described as a twinge mild in severity. Worse when retracting the foreskin. Nothing else particularly makes better or worse. No improvement 2 hydrocortisone cream applications over the past few days. He denies penile discharge, testicular pain, dysuria, nor skin changes of the penis or shaft.  He requests samples for his COPD medications. Currently he is not needing albuterol but continues on Spiriva and Symbicort. He denies any shortness of breath, cough, nor chest discomfort. He is in a donut hole and financially having difficulty filling all of these medications     Review Of Systems Outlined In HPI  Past Medical History  Diagnosis Date  . COPD (chronic obstructive pulmonary disease)   . Paroxysmal atrial fibrillation     [facility]    . Essential hypertension, benign   . Other  and unspecified hyperlipidemia   . Peripheral artery disease     Left ABI of leg abnormal.      No past surgical history on file. Family History  Problem Relation Age of Onset  . Heart failure Mother   . Cancer Father     History   Social History  . Marital Status: Married    Spouse Name: N/A    Number of Children: 2  . 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 Smoking status: Former Games developermoker  . Smokeless tobacco: Never Used  . Alcohol Use: No  . Drug Use: No  . Sexual Activity: Not on file   Other Topics Concern  . Not on file   Social History Narrative  . No narrative on file     Objective: BP 144/89  Pulse 100  Wt 146 lb (66.225 kg)  General: Alert and Oriented, No Acute Distress HEENT: Pupils equal, round, reactive to light. Conjunctivae clear.   moist mucous membranes pharynx unremarkable  Lungs: Clear to auscultation bilaterally, no wheezing/ronchi/rales.  Comfortable work of breathing. Good air movement. Cardiac:  irregularly irregular rhythm. Normal S1/S2.  No murmurs, rubs, nor gallops.   Genitourinary: Testicles nontender and unremarkable scrotum. When he retracts his foreskin there is mild to moderate erythema with mild satellite lesions of the foreskin. No other penile abnormalities nor discharge from the meatus  Extremities: No peripheral edema.  Strong peripheral  pulses.  Mental Status: No depression, anxiety, nor agitation. Skin: Warm and dry.  Assessment & Plan: Kenneth Lin was seen today for high pulse.  Diagnoses and associated orders for this visit:  Essential hypertension, benign - diltiazem (CARDIZEM) 30 MG tablet; Take 1 tablet (30 mg total) by mouth every 6 (six) hours as needed (Only if pulse is above 90.).  Paroxysmal atrial fibrillation - diltiazem (CARDIZEM) 30 MG tablet; Take 1 tablet (30 mg total) by mouth every 6 (six) hours as needed (Only if pulse is above 90.).  Candidiasis  of penis - clotrimazole-betamethasone (LOTRISONE) cream; Apply to affected area twice a day for two weeks.  COPD    Essential hypertension: Controlled continue diltiazem Paroxysmal atrial fibrillation: Uncontrolled with less than optimal rate control. Continue long-acting diltiazem and provided with 30 mg as needed use for any pulse above 90 to use no more frequently than every 6 hours. We will use this to determine if his long-acting diltiazem needs to be titrated up. I've asked him to keep a record of how might times he needs diltiazem short acting and call me in a week COPD: Controlled given samples of Spiriva x2 and Symbicort x2 Candidiasis of the penis: Start Lotrisone for the next 2 weeks  40 minutes spent face-to-face during visit today of which at least 50% was counseling or coordinating care regarding: 1. Essential hypertension, benign   2. Paroxysmal atrial fibrillation   3. Candidiasis of penis   4. COPD      Return in about 3 months (around 09/13/2014).

## 2014-06-14 ENCOUNTER — Telehealth: Payer: Self-pay | Admitting: Family Medicine

## 2014-06-14 NOTE — Telephone Encounter (Signed)
[name], Will you please confirm that the patient is taking his daily long acting 180mg  diazepam and an additional fast acting 30mg  every 6 hours if pulse is >100.  If that's the case and pulse is still above 100 let me know and I'll prescribe a higher dose of the long acting version.

## 2014-06-14 NOTE — Telephone Encounter (Signed)
Patient called to say he seen Dr.Hommel yesterday and Dr.Hommel instructed him to take a new pill Cardizem 30mg  whenever his heart rate is OVER 90.... His heart rate has been running over 90 and he can not really get it below 90 with the new pill. He states that his HR was 120 with his regular BP and he waited and took the new pill  And it still is running 100. Patient would like for Dr.Hommel to call him back with his advice. Call his HOME number. Thanks

## 2014-06-15 ENCOUNTER — Telehealth: Payer: Self-pay | Admitting: Family Medicine

## 2014-06-15 NOTE — Telephone Encounter (Signed)
Pt notified; he states his pulse went up again today a little over 100 after having taken the 30 mg diltazem;he wanted me to let you know that. I just reiterated what your advice was

## 2014-06-15 NOTE — Telephone Encounter (Signed)
Pt is taking the diltiaziem as directed along with the fast acting. His pulse is actually doing better today per patient. It has been less than 100.

## 2014-06-15 NOTE — Telephone Encounter (Signed)
Kenneth Lin, Can you please ask the patient to let us know by Friday how may of the fast acting 30mg  diltiazem tablets he's using on average Tuesday, Wednesday, and Thursday.  This will be used to determine if his long acting dose needs to be adjusted.

## 2014-06-17 ENCOUNTER — Telehealth: Payer: Self-pay | Admitting: *Deleted

## 2014-06-17 MED ORDER — BUDESONIDE-FORMOTEROL FUMARATE 160-4.5 MCG/ACT IN AERO: 2.0000 | INHALATION_SPRAY | Freq: Two times a day (BID) | RESPIRATORY_TRACT | Status: DC

## 2014-06-17 NOTE — Telephone Encounter (Signed)
Pt requested a sample of the symbicort 160/4.5. I sample dispensed

## 2014-06-20 ENCOUNTER — Ambulatory Visit: Payer: Medicare Other | Admitting: Family Medicine

## 2014-06-22 ENCOUNTER — Ambulatory Visit (INDEPENDENT_AMBULATORY_CARE_PROVIDER_SITE_OTHER): Payer: Medicare Other | Admitting: Family Medicine

## 2014-06-22 ENCOUNTER — Encounter: Payer: Self-pay | Admitting: Family Medicine

## 2014-06-22 VITALS — BP 165/98 | HR 81 | Temp 97.7°F | Wt 149.1 lb

## 2014-06-22 DIAGNOSIS — B3749 Other urogenital candidiasis: Secondary | ICD-10-CM

## 2014-06-22 DIAGNOSIS — I4891 Unspecified atrial fibrillation: Secondary | ICD-10-CM

## 2014-06-22 DIAGNOSIS — I480 Paroxysmal atrial fibrillation: Secondary | ICD-10-CM

## 2014-06-22 MED ORDER — FLUCONAZOLE 100 MG PO TABS: ORAL_TABLET | ORAL | Status: DC

## 2014-06-22 NOTE — Progress Notes (Signed)
CC: Kenneth Lin is a 80 y.o. male is here for Follow-up   Subjective: HPI:  Followup atrial fibrillation: He has been taking diltiazem 180 mg long-acting on a daily basis. He's noticed his pulse will go up to 100 and no greater if he is up vacuuming the house however at rest it is consistently in the 70s to 80s. He has not had to take an additional diltiazem, short acting, for over a week now. Denies rapid heartbeat, shortness of breath, motor or sensory disturbances, peripheral edema.  Followup penile candidiasis. He has been using clotrimazole/betamethasone twice a day for the past week without much benefit from redness and a stinging sensation on the foreskin. He is unsure whether or not getting better or worse, symptoms are typically mild in severity and most noticeable when he is retracting the foreskin. Denies penile discharge, dysuria, testicular pain or penile pain other than the above. He's pretty anxious that he could have been harboring a sexually transmitted disease for decades since he last had sex. Denies fevers, chills, unintentional weight loss, skin lesions other than that described above.   Review Of Systems Outlined In HPI  Past Medical History  Diagnosis Date  . COPD (chronic obstructive pulmonary disease)   . Paroxysmal atrial fibrillation     [facility]    . Essential hypertension, benign   . Other and unspecified hyperlipidemia   . Peripheral artery disease     Left ABI of leg abnormal.      No past surgical history on file. Family History  Problem Relation Age of Onset  . Heart failure Mother   . Cancer Father     History   Social History  . Marital Status: Married    Spouse Name: N/A    Number of Children: 2  . 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 Smoking status: Former Games developermoker  . Smokeless tobacco: Never Used  . Alcohol Use: No  . Drug Use: No  . Sexual Activity:  Not on file   Other Topics Concern  . Not on file   Social History Narrative  . No narrative on file     Objective: BP 165/98  Pulse 81  Temp(Src) 97.7 F (36.5 C) (Oral)  Wt 149 lb 1.9 oz (67.64 kg)  General: Alert and Oriented, No Acute Distress HEENT: Pupils equal, round, reactive to light. Conjunctivae clear.  Moist mucous membranes pharynx unremarkable Lungs: Clear to auscultation bilaterally, no wheezing/ronchi/rales.  Comfortable work of breathing. Good air movement. Cardiac: Irregularly irregular rhythm. Normal S1/S2.  No murmurs, rubs, nor gallops.   Genitourinary: No urethral discharge with milking, just proximal to the glans of the penis he has a papular erythematous rash that is mild in severity without any other skin abnormalities in the genital region. Extremities: No peripheral edema.  Strong peripheral pulses.  Mental Status: No depression, anxiety, nor agitation. Skin: Warm and dry.  Assessment & Plan: Kenneth Lin was seen today for follow-up.  Diagnoses and associated orders for this visit:  Paroxysmal atrial fibrillation  Yeast dermatitis of penis - Discontinue: fluconazole (DIFLUCAN) 100 MG tablet; Two by mouth on the first day then one tab daily for a total of five days. - fluconazole (DIFLUCAN) 100 MG tablet; Two by mouth on the first day then one tab daily for a total of five days.    Atrial fibrillation: Controlled continue current 180 mg long-acting  dose of diltiazem Yeast dermatitis: Start fluconazole for more aggressive therapy, he can discontinue betamethasone/clotrimazole. It took a good deal of time for discussing with him that this is not a sexually transmitted disease and there is a extremely low likelihood that he has been harboring any sexually transmitted disease for decades since she's had sex last.  We came to the joint conclusion that we can order labs in the future should this not improve and that we will also send him to urology for specialist  opinion on the matter.  40 minutes spent face-to-face during visit today of which at least 50% was counseling or coordinating care regarding: 1. Paroxysmal atrial fibrillation   2. Yeast dermatitis of penis      Return if symptoms worsen or fail to improve.

## 2014-06-27 ENCOUNTER — Telehealth: Payer: Self-pay | Admitting: *Deleted

## 2014-06-27 ENCOUNTER — Other Ambulatory Visit: Payer: Self-pay | Admitting: Family Medicine

## 2014-06-27 DIAGNOSIS — B3749 Other urogenital candidiasis: Secondary | ICD-10-CM

## 2014-06-27 MED ORDER — FLUCONAZOLE 100 MG PO TABS: ORAL_TABLET | ORAL | Status: DC

## 2014-06-27 NOTE — Telephone Encounter (Signed)
Refill was provided and his penis irritation continues into the week my next step would be to refer him to urology

## 2014-06-27 NOTE — Addendum Note (Signed)
Addended by: Laren BoomHOMMEL, SEAN on: 06/27/2014 01:23 PM   Modules accepted: Orders

## 2014-06-27 NOTE — Telephone Encounter (Signed)
Kenneth Lin come into the office this morning and stated that he still has a very slight pain and some swelling. He would like to get a refill on the Fluconazole. Please advise./ Dyke MaesStacy Irizarry, CMA

## 2014-06-28 ENCOUNTER — Telehealth: Payer: Self-pay

## 2014-06-28 DIAGNOSIS — N4889 Other specified disorders of penis: Secondary | ICD-10-CM

## 2014-06-28 NOTE — Telephone Encounter (Signed)
Mr. Kenneth Lin called and stated that he is still having a lot of penis irritation and would like to go ahead and get the referral to Urology like Dr. Ivan AnchorsHommel suggested. Dyke Maes/Stacy Irizarry, CMA

## 2014-07-04 ENCOUNTER — Other Ambulatory Visit: Payer: Self-pay | Admitting: Family Medicine

## 2014-07-05 ENCOUNTER — Other Ambulatory Visit: Payer: Self-pay | Admitting: Family Medicine

## 2014-07-21 ENCOUNTER — Other Ambulatory Visit: Payer: Self-pay | Admitting: Family Medicine

## 2014-07-22 ENCOUNTER — Other Ambulatory Visit: Payer: Self-pay

## 2014-07-22 ENCOUNTER — Other Ambulatory Visit: Payer: Self-pay | Admitting: *Deleted

## 2014-07-22 ENCOUNTER — Telehealth: Payer: Self-pay

## 2014-07-22 ENCOUNTER — Other Ambulatory Visit: Payer: Self-pay | Admitting: Family Medicine

## 2014-07-22 MED ORDER — ALBUTEROL SULFATE HFA 108 (90 BASE) MCG/ACT IN AERS: 2.0000 | INHALATION_SPRAY | Freq: Four times a day (QID) | RESPIRATORY_TRACT | Status: DC | PRN

## 2014-07-22 NOTE — Telephone Encounter (Signed)
Mr. Kenneth Lin called and stated that he went to See Dr. Benancio [facility]Newsome at [facility] and pt complains that he was placed in the room and had to wait for an hr and 10 minutes for the doctor. I apologized and the pt went on to state that when the doctor came in the room the doctor was "really mean' and had a "bad attitude" he also stated that the doctor was only in there for about 10 minutes. Pt states that he has a little irritation but that he has been putting powder on. I offered him appointment to come in on Monday for a follow up to see Dr. Ivan AnchorsHommel but the pt said he doesn't know if he can come in yet or not. He said that he will have to call Monday morning and let me know./Keniesha Adderly,CMA

## 2014-07-23 ENCOUNTER — Encounter: Payer: Self-pay | Admitting: Emergency Medicine

## 2014-07-23 ENCOUNTER — Emergency Department
Admission: EM | Admit: 2014-07-23 | Discharge: 2014-07-23 | Disposition: A | Discharge status: Home / Self Care | Payer: Medicare Other | Source: Home / Self Care

## 2014-07-23 DIAGNOSIS — N471 Phimosis: Secondary | ICD-10-CM

## 2014-07-23 DIAGNOSIS — N481 Balanitis: Secondary | ICD-10-CM

## 2014-07-23 DIAGNOSIS — N476 Balanoposthitis: Secondary | ICD-10-CM

## 2014-07-23 MED ORDER — NYSTATIN 100000 UNIT/GM EX CREA: 1.0000 "application " | TOPICAL_CREAM | Freq: Two times a day (BID) | CUTANEOUS | Status: DC

## 2014-07-23 NOTE — ED Notes (Signed)
Pt has been seen by Dr. Ivan AnchorsHommel and a urologist in the last 2 months for penile symptoms including: pulsating, buring when pulling foreskin back.  Pt says he has had this "odd" feeling in his penis on and off lasting only a second or two.

## 2014-07-23 NOTE — ED Provider Notes (Signed)
CSN: [id number]     Arrival date & time 07/23/14  1403 History   None    Chief Complaint  Patient presents with  . Groin Swelling    foreskin swelling   patient presents to urgent care on Saturday. (no acute symptoms today)  HPI Complains of "odd feeling" glans penis, slight irritation without itch, intermittently for 2 months or so. He describes this as a pulsating, burning feeling when he pulls his foreskin back. The foreskin is somewhat tight but he is able to pull it back. No urinary symptoms Saw [name]  (his PCP)for this 6/24. Placed on 2 courses of by mouth fluconazole.--He thinks it helped but he's not sure. Also has used topical antifungal with a potent steroid cream in the past, he's not sure if that helped. Also was referred to urologist for persistent symptoms.--He states he was not pleased with that visit at the urologist, as the urologist advised him to simply use better hygiene, baby powder with aloe, and consider circumcision because of tight foreskin.   No fever or chills Past Medical History  Diagnosis Date  . COPD (chronic obstructive pulmonary disease)   . Paroxysmal atrial fibrillation     Seeing [name] at [facility].    . Essential hypertension, benign   . Other and unspecified hyperlipidemia   . Peripheral artery disease     Left ABI of leg abnormal.     History reviewed. No pertinent past surgical history. Family History  Problem Relation Age of Onset  . Heart failure Mother   . Cancer Father    History  Substance Use Topics  . Smoking status: Former Games developer  . Smokeless tobacco: Never Used  . Alcohol Use: No    Review of Systems  All other systems reviewed and are negative.   Allergies  Gabapentin  Home Medications   Prior to Admission medications   Medication Sig Start Date End Date Taking? Authorizing Provider  albuterol (PROVENTIL HFA;VENTOLIN HFA) 108 (90 BASE) MCG/ACT inhaler Inhale 2 puffs into the lungs every 6 (six) hours as needed  for wheezing. 07/22/14   Laren Boom, DO  aspirin 325 MG tablet Take 1 tablet (325 mg total) by mouth daily. 09/08/13   Lewayne Bunting, MD  budesonide-formoterol (SYMBICORT) 160-4.5 MCG/ACT inhaler Inhale 2 puffs into the lungs 2 (two) times daily. 06/17/14   Sean Hommel, DO  diazepam (VALIUM) 5 MG tablet TAKE 1 TABLET BY MOUTH EV ERY 12 HOURS AS NEEDED FOR ANXIETY    Sean Hommel, DO  diltiazem (CARDIZEM CD) 180 MG 24 hr capsule Take 1 capsule (180 mg total) by mouth daily. 09/28/13   Sean Hommel, DO  diltiazem (CARDIZEM) 30 MG tablet TAKE 1 TABLET EVERY 6 HOURS AS NEEDED    Sean Hommel, DO  fluconazole (DIFLUCAN) 100 MG tablet Two by mouth on the first day then one tab daily for a total of five days. 06/27/14   Laren Boom, DO  hydrocortisone valerate cream (WESTCORT) 0.2 % APPLY AS DIRECTED TWICE DAILY 12/28/13   Laren Boom, DO  nystatin (MYCOSTATIN) 100000 UNIT/ML suspension Take 5 mLs (500,000 Units total) by mouth 4 (four) times daily. Retain in mouth for up to 5 minutes,stop 48 hours after resolution of thrush 03/14/14   Sean Hommel, DO  nystatin cream (MYCOSTATIN) Apply 1 application topically 2 (two) times daily. 07/23/14   Lajean Manes, MD  SPIRIVA HANDIHALER 18 MCG inhalation capsule PLACE 1 CAPSULE (18 MCG TOTAL) INTO INHALER AND INHALE DAILY. 02/07/14  Sean Hommel, DO  zolpidem (AMBIEN) 5 MG tablet TAKE 1 TABLET BY MOUTH AT BEDTIME AS NEEDED FOR SLEEP    Sean Hommel, DO   BP 153/89  Pulse 87  Ht 5\' 10"  (1.778 m)  Wt 148 lb 8 oz (67.359 kg)  BMI 21.31 kg/m2  SpO2 97% Physical Exam  Nursing note and vitals reviewed. Constitutional: He is oriented to person, place, and time. He appears well-developed and well-nourished. No distress.  HENT:  Head: Normocephalic and atraumatic.  Eyes: Conjunctivae and EOM are normal. Pupils are equal, round, and reactive to light. No scleral icterus.  Neck: Normal range of motion.  Cardiovascular: Normal rate.   Pulmonary/Chest: Effort normal.    Abdominal: He exhibits no distension.  Genitourinary:    Uncircumcised.  Minimal erythema edge of glans penis. Noncircumcised. He is able to retract foreskin, and he has some remnants of baby powder and a scant whitish discharge on the glans. Urethra within normal limits without discharge  Musculoskeletal: Normal range of motion.  Neurological: He is alert and oriented to person, place, and time.  Skin: Skin is warm.  Psychiatric: He has a normal mood and affect.    ED Course  Procedures (including critical care time) Labs Review Labs Reviewed  FUNGUS CULTURE W SMEAR    Imaging Review No results found.   MDM   1. Balanitis   2. Phimosis    Treatment options discussed at length, as well as risks, benefits, alternatives. Patient voiced understanding and agreement with the following plans: With a sterile Q-tip, I swabbed the scant whitish material from glans penis and sent in a sterile container for fungal smear and fungal culture. Patient understands that it may take 4 weeks or more to get the results back .  I explained that I am not certain if this indeed is fungal balanitis, or simply irritation of the glans from mild phimosis. He requested prescription for cream, and I prescribed Mycostatin cream.  Over 30 minutes spent, greater than 50% of the time spent for counseling and coordination of care.  Follow-up with your primary care doctor in 5-7 days if not improving, or sooner if symptoms become worse. Discussed option of following up with urologist for circumcision. Discussed option of seeing a dermatologist for another opinion. He states he'll discuss this with Dr. Ivan AnchorsHommel his PCP. Precautions discussed. Red flags discussed. Questions invited and answered. Patient voiced understanding and agreement.   Lajean Manesavid Massey, MD 07/23/14 [phone]

## 2014-07-25 ENCOUNTER — Telehealth: Payer: Self-pay | Admitting: Emergency Medicine

## 2014-07-25 LAB — KOH PREP: RESULT - KOH: NONE SEEN

## 2014-07-29 ENCOUNTER — Ambulatory Visit (INDEPENDENT_AMBULATORY_CARE_PROVIDER_SITE_OTHER): Payer: Medicare Other | Admitting: Family Medicine

## 2014-07-29 ENCOUNTER — Encounter: Payer: Self-pay | Admitting: Family Medicine

## 2014-07-29 VITALS — BP 166/89 | HR 87 | Ht 70.0 in | Wt 146.0 lb

## 2014-07-29 DIAGNOSIS — N4829 Other inflammatory disorders of penis: Secondary | ICD-10-CM

## 2014-07-29 MED ORDER — CLOBETASOL PROPIONATE 0.05 % EX CREA: 1.0000 "application " | TOPICAL_CREAM | Freq: Two times a day (BID) | CUTANEOUS | Status: DC

## 2014-07-29 NOTE — Progress Notes (Signed)
CC: Kenneth ModenaRonald Lin is a 80 y.o. male is here for Follow-up   Subjective: HPI:  Patient returns with complaints of persistent foreskin pain localized on the right aspect of the foreskin as it folded over the glans. It has been present for one to 2 months now. Previously was accompanied by a burning sensation around the glans and within the entire foreskin however this is resolved after Lotrimin then fluconazole and now 5 days of nystatin. His chief complaint however has not gotten any better from any of the above interventions. He saw a urologist who informed him to keep the area dry and that no further intervention was needed other than consideration of a circumcision which the patient is not interested in.  He denies penile pain elsewhere but is quite anxious today that something is anatomically wrong with him beyond being uncircumcised. Denies fevers, chills, penile discharge, dysuria, testicular pain nor skin changes elsewhere.  Review Of Systems Outlined In HPI  Past Medical History  Diagnosis Date  . COPD (chronic obstructive pulmonary disease)   . Paroxysmal atrial fibrillation     [facility].    . Essential hypertension, benign   . Other and unspecified hyperlipidemia   . Peripheral artery disease     Left ABI of leg abnormal.      No past surgical history on file. Family History  Problem Relation Age of Onset  . Heart failure Mother   . Cancer Father     History   Social History  . Marital Status: Married    Spouse Name: N/A    Number of Children: 2  . 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 Smoking status: Former Games developermoker  . Smokeless tobacco: Never Used  . Alcohol Use: No  . Drug Use: No  . Sexual Activity: Not on file   Other Topics Concern  . Not on file   Social History Narrative  . No narrative on file     Objective: BP 166/89  Pulse 87  Ht 5\' 10"  (1.778 m)  Wt 146 lb  (66.225 kg)  BMI 20.95 kg/m2  General: Alert and Oriented, No Acute Distress, appears extremely concerned about the anatomy of his penis HEENT: Pupils equal, round, reactive to light. Conjunctivae clear.  Moist mucous membranes pharynx unremarkable Cardiac: Regular rate and rhythm.  Genitourinary: Uncircumcised, bilaterally descended testes without pain. He is easily able to retract his foreskin which does reveal a small approximate 3 mm diameter macule of erythema where he localizes his pain otherwise there are no gross abnormalities. Extremities: No peripheral edema.  Strong peripheral pulses.  Mental Status: No depression, anxiety, nor agitation. Skin: Warm and dry.  Assessment & Plan: [name] was seen today for follow-up.  Diagnoses and associated orders for this visit:  Foreskin inflammation - Discontinue: clobetasol cream (TEMOVATE) 0.05 %; Apply 1 application topically 2 (two) times daily. For only two weeks. - clobetasol cream (TEMOVATE) 0.05 %; Apply 1 application topically 2 (two) times daily. For only two weeks.    Foreskin inflammation: I've advised him to stop using nystatin nor any other antifungal medication since a KOH prep from the 25th of this month was negative. We'll keep an eye on a fungal culture that was obtained however suspicion of fungal infection right now is very low in my mind. Low suspicion of bacterial infection, I like him to start a high dose of clobetasol  cream to help with any inflammation. Discussed that if he is ever unable to reduce his foreskin over the glans anteriorly at this would require emergent followup here or in the emergency room.  He had numerous questions regarding whether or not his anatomy was normal, time was taken to answer all his questions satisfactorily.  40 minutes spent face-to-face during visit today of which at least 99% was counseling or coordinating care regarding: 1. Foreskin inflammation      Return if symptoms worsen or  fail to improve.

## 2014-08-01 LAB — CULTURE, FUNGUS WITHOUT SMEAR

## 2014-08-02 ENCOUNTER — Telehealth: Payer: Self-pay

## 2014-08-02 ENCOUNTER — Telehealth: Payer: Self-pay | Admitting: Family Medicine

## 2014-08-02 NOTE — Telephone Encounter (Signed)
[name], Will you please let patient know that to my surprise his fungal culture actually did begin to grow out a strain of yeast. I would encourage him to continue taking the clobetasol cream which is a steroid I gave him earlier this week but also to use the nystatin cream provided to him by urgent care. Treatment would be a full 2 weeks of using both twice a day.

## 2014-08-02 NOTE — ED Notes (Addendum)
Culture results given to Dr Ivan AnchorsHommel, his PCP.

## 2014-08-03 ENCOUNTER — Encounter: Payer: Self-pay | Admitting: Family Medicine

## 2014-08-03 ENCOUNTER — Other Ambulatory Visit: Payer: Self-pay | Admitting: Family Medicine

## 2014-08-03 ENCOUNTER — Ambulatory Visit (INDEPENDENT_AMBULATORY_CARE_PROVIDER_SITE_OTHER): Payer: Medicare Other | Admitting: Family Medicine

## 2014-08-03 VITALS — BP 159/82 | HR 85 | Wt 145.0 lb

## 2014-08-03 DIAGNOSIS — B379 Candidiasis, unspecified: Secondary | ICD-10-CM

## 2014-08-03 DIAGNOSIS — N529 Male erectile dysfunction, unspecified: Secondary | ICD-10-CM | POA: Insufficient documentation

## 2014-08-03 MED ORDER — FLUCONAZOLE 200 MG PO TABS: 200.0000 mg | ORAL_TABLET | Freq: Every day | ORAL | Status: DC

## 2014-08-03 MED ORDER — TERBINAFINE HCL 250 MG PO TABS: 250.0000 mg | ORAL_TABLET | Freq: Every day | ORAL | Status: DC

## 2014-08-03 MED ORDER — SILDENAFIL CITRATE 50 MG PO TABS: 50.0000 mg | ORAL_TABLET | Freq: Every day | ORAL | Status: DC | PRN

## 2014-08-03 MED ORDER — FLUCONAZOLE 200 MG PO TABS
200.0000 mg | ORAL_TABLET | Freq: Every day | ORAL | Status: DC
Start: 2014-08-03 — End: 2014-08-18

## 2014-08-03 NOTE — Progress Notes (Signed)
CC: Kenneth ModenaRonald Lin is a 80 y.o. male is here for f/u irritation on penis   Subjective: HPI:  Patient presents to discuss concerns regarding recent fungal culture showing Candida albicans which grew out from a smear around his foreskin. He continues to have a "twinge"sensation on the lateral aspect of his foreskin when retracting his foreskin. Symptoms are mild in severity and have not changed since I saw him last. He appears extremely anxious as he's done hours of research on Candida albicans and found out that a strain of this headache undetermined location has been found to be resistant to fluconazole. Additionally he is quite anxious that he found literature that reports Candida in the blood stream can become deadly. He's worried that his CD4 counts are low enough to where he is going to be battling fungal infections for the rest of his life. He has many well-thought-out questions regarding the treatment of candida infections both cutaneous and systemically. He denies any fevers, chills, swollen lymph nodes, penile discharge, dysuria, nor skin changes elsewhere in that described on the penis. He only use clobetasol for 3 days because he was fearful of possible side effects most importantly adrenal suppression.  At the end of our visit he inquires whether or not he would be a candidate for Viagra. He reports ever since starting diltiazem he's been unable to achieve or maintain an erection. He denies exertional chest pain, limb claudication and does not take nitrates for any medical condition  Review Of Systems Outlined In HPI  Past Medical History  Diagnosis Date  . COPD (chronic obstructive pulmonary disease)   . Paroxysmal atrial fibrillation     [facility]    . Essential hypertension, benign   . Other and unspecified hyperlipidemia   . Peripheral artery disease     Left ABI of leg abnormal.      No past surgical history on file. Family History  Problem Relation Age of  Onset  . Heart failure Mother   . Cancer Father     History   Social History  . Marital Status: Married    Spouse Name: N/A    Number of Children: 2  . 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 Smoking status: Former Games developermoker  . Smokeless tobacco: Never Used  . Alcohol Use: No  . Drug Use: No  . Sexual Activity: Not on file   Other Topics Concern  . Not on file   Social History Narrative  . No narrative on file     Objective: BP 159/82  Pulse 85  Wt 145 lb (65.772 kg)  Vital signs reviewed. General: Alert and Oriented, No Acute Distress HEENT: Pupils equal, round, reactive to light. Conjunctivae clear.  External ears unremarkable.  Moist mucous membranes. Lungs: Clear and comfortable work of breathing, speaking in full sentences without accessory muscle use. Cardiac: Regular rate and rhythm.  Neuro: CN II-XII grossly intact, gait normal. Extremities: No peripheral edema.  Strong peripheral pulses.  Mental Status: Mild anxiety. No agitation or depression. Logical though process. Genitourinary: Uncircumcised penis without discharge, unremarkable for skin erythema at last visit is now gone. Skin: Warm and dry.  Assessment & Plan: [name] was seen today for f/u irritation on penis.  Diagnoses and associated orders for this visit:  Candida albicans infection - Discontinue: fluconazole (DIFLUCAN) 200 MG tablet; Take 1 tablet (200 mg total) by mouth daily. -  Discontinue: terbinafine (LAMISIL) 250 MG tablet; Take 1 tablet (250 mg total) by mouth daily. - terbinafine (LAMISIL) 250 MG tablet; Take 1 tablet (250 mg total) by mouth daily. - fluconazole (DIFLUCAN) 200 MG tablet; Take 1 tablet (200 mg total) by mouth daily.  Erectile dysfunction, unspecified erectile dysfunction type - sildenafil (VIAGRA) 50 MG tablet; Take 1 tablet (50 mg total) by mouth daily as needed for erectile dysfunction.    Candida albicans  infection: As always a considerable time was taken to discuss risks and benefits of all treatments for yeast infections and to provide feedback about questions regarding his research on Candida albicans. He wants to be aggressive to ensure that we get the majority of his yeast colonization resolved therefore start 2 weeks of fluconazole and terbinafine. Discussed stopping medication immediately for any nausea right upper quadrant pain or jaundice. Reassurance was provided that there is no indication at this time to check his CD4 counts and it's very unlikely that this infection is systemic. Erectile dysfunction: Samples of Viagra were provided with risks and benefits discussed  40 minutes spent face-to-face during visit today of which at least 50% was counseling or coordinating care regarding: 1. Candida albicans infection   2. Erectile dysfunction, unspecified erectile dysfunction type      Return if symptoms worsen or fail to improve.

## 2014-08-03 NOTE — Telephone Encounter (Signed)
Pt will be notified at his appt today

## 2014-08-10 ENCOUNTER — Ambulatory Visit (INDEPENDENT_AMBULATORY_CARE_PROVIDER_SITE_OTHER): Payer: Medicare Other | Admitting: Family Medicine

## 2014-08-10 ENCOUNTER — Encounter: Payer: Self-pay | Admitting: Family Medicine

## 2014-08-10 VITALS — BP 155/89 | HR 100 | Ht 70.0 in | Wt 149.0 lb

## 2014-08-10 DIAGNOSIS — B379 Candidiasis, unspecified: Secondary | ICD-10-CM

## 2014-08-10 DIAGNOSIS — N489 Disorder of penis, unspecified: Secondary | ICD-10-CM

## 2014-08-10 DIAGNOSIS — N4889 Other specified disorders of penis: Secondary | ICD-10-CM

## 2014-08-10 NOTE — Progress Notes (Signed)
CC: Kenneth Lin is a 80 y.o. male is here for No chief complaint on file.   Subjective: HPI:  Patient presents for same day appointment with concerns about his candida infection on the penis. He has been taking fluconazole and terbinafine for 7 days now without any known side effects. He tells me that the twinge and redness that he noticed on his foreskin was 100% relieved after one dose and absent for 2 days however has slowly returned. But better than that prior to starting the medication.  He reports symptoms are mild in severity without discharge or any new abnormality in the foreskin area or the shaft of the penis. No other interventions since I saw him last. He is worried that the current antifungal regimen is probably not working. He has no other significant symptoms.  He wants to know if he should restart clobetasol. He's also asking if we can check a CRP today to determine if he is a bacterial infection on his penis.  He denies fevers, chills, new skin lesions, dysuria, no discharge, testicular pain, unintentional weight loss.  He's been doing some research and found out that yeast infections can occur in people with elevated blood sugar. He denies polyuria polyphagia polydipsia and of note urinalysis and July at his urologist office did not have any glucose.   Review Of Systems Outlined In HPI  Past Medical History  Diagnosis Date  . COPD (chronic obstructive pulmonary disease)   . Paroxysmal atrial fibrillation     Seeing [name] at [facility]    . Essential hypertension, benign   . Other and unspecified hyperlipidemia   . Peripheral artery disease     Left ABI of leg abnormal.      No past surgical history on file. Family History  Problem Relation Age of Onset  . Heart failure Mother   . Cancer Father     History   Social History  . Marital Status: Married    Spouse Name: N/A    Number of Children: 2  . Years of Education: N/A   Occupational History  . RETIRED  SHERIFF     Social History Main Topics  . Smoking status: Former Games developer  . Smokeless tobacco: Never Used  . Alcohol Use: No  . Drug Use: No  . Sexual Activity: Not on file   Other Topics Concern  . Not on file   Social History Narrative  . No narrative on file     Objective: BP 155/89  Pulse 100  Ht 5\' 10"  (1.778 m)  Wt 149 lb (67.586 kg)  BMI 21.38 kg/m2  Vital signs reviewed. General: Alert and Oriented, No Acute Distress HEENT: Pupils equal, round, reactive to light. Conjunctivae clear.  External ears unremarkable.  Moist mucous membranes. Lungs: Clear and comfortable work of breathing, speaking in full sentences without accessory muscle use. Cardiac: Regular rate and rhythm.  Genitourinary: Uncircumcised, no penile discharge, no foreign matter at the glans of the penis, no erythema on the head shaft nor foreskin of the penis, no gross abnormalities. Neuro: CN II-XII grossly intact, gait normal. Extremities: No peripheral edema.  Strong peripheral pulses.  Mental Status: No depression, anxiety, nor agitation. Logical though process. Skin: Warm and dry.  Assessment & Plan: Jhovany was seen today for no specified reason.  Diagnoses and associated orders for this visit:  Candida infection - Culture, fungus without smear  Penile pain - Culture, fungus without smear    Reassurance was provided that there was  no sign of worsening infection and that clinically looks like things are actually improving, for the past 2 visits of noticed an improvement and now absence of any erythema.  Advised him to not start clobetasol. He wants to put something on the foreskin rather than just leaving it be, discussed that nystatin which he already has is not necessary but certainly will not hurt.  I've encouraged him to avoid getting a CRP since its nonspecific and there is no sign of a bacterial infection. Offered A1c to address his blood sugar concerns, he left before providing a  definitive answer on whether or not he wanted this. He seems to be desperate for reassurance that his candida infection is under control therefore will reculture the glans of the penis for yeast since clinical reassurance is not seeming to help much right now.  40 minutes spent face-to-face during visit today of which at least 50% was counseling or coordinating care regarding: 1. Candida infection   2. Penile pain       Return if symptoms worsen or fail to improve.

## 2014-08-18 ENCOUNTER — Encounter: Payer: Self-pay | Admitting: Family Medicine

## 2014-08-18 ENCOUNTER — Telehealth: Payer: Self-pay | Admitting: *Deleted

## 2014-08-18 ENCOUNTER — Ambulatory Visit (INDEPENDENT_AMBULATORY_CARE_PROVIDER_SITE_OTHER): Payer: Medicare Other | Admitting: Family Medicine

## 2014-08-18 ENCOUNTER — Other Ambulatory Visit: Payer: Self-pay | Admitting: Family Medicine

## 2014-08-18 VITALS — BP 179/103 | HR 89 | Wt 145.0 lb

## 2014-08-18 DIAGNOSIS — B3749 Other urogenital candidiasis: Secondary | ICD-10-CM

## 2014-08-18 DIAGNOSIS — N489 Disorder of penis, unspecified: Secondary | ICD-10-CM

## 2014-08-18 DIAGNOSIS — B3742 Candidal balanitis: Secondary | ICD-10-CM

## 2014-08-18 DIAGNOSIS — N4889 Other specified disorders of penis: Secondary | ICD-10-CM

## 2014-08-18 LAB — CULTURE, FUNGUS WITHOUT SMEAR: Organism ID, Bacteria: NO GROWTH

## 2014-08-18 NOTE — Telephone Encounter (Signed)
Mr. Kenneth Lin came in to office for appointment. Corliss SkainsJamie Kalianna Verbeke, CMA

## 2014-08-18 NOTE — Progress Notes (Signed)
CC: Kenneth Lin is a 80 y.o. male is here for Follow-up   Subjective: HPI:  Patient was notified upon arrival prior to our office requesting a co-pay that his fungal culture has been negative as of this morning and that the message he got about candida been present was incorrect and was only in reference to the culture from July. He still desires to be seen.  Patient complains of penile pain localized on the foreskin of the right aspect of the shaft of his penis that has been present for about a month or 2 now but has been slowly improving a weekly basis. He just finished 2 weeks of fluconazole and terbinafine along with one week of topical nystatin to the shaft of the penis.  He's concerned that candida infection may still be present on his penis. He describes the pain as a twinge present only when retracting the foreskin and absent for hours afterwards. Nothing else seems to make better or worse. He denies dysuria, skin changes elsewhere, fevers, chills, nor swollen lymph nodes or testicular pain.   Review Of Systems Outlined In HPI  Past Medical History  Diagnosis Date  . COPD (chronic obstructive pulmonary disease)   . Paroxysmal atrial fibrillation     Seeing Dr. Orson Lin at [facility].    . Essential hypertension, benign   . Other and unspecified hyperlipidemia   . Peripheral artery disease     Left ABI of leg abnormal.      No past surgical history on file. Family History  Problem Relation Age of Onset  . Heart failure Mother   . Cancer Father     History   Social History  . Marital Status: Married    Spouse Name: N/A    Number of Children: 2  . 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 Smoking status: Former Games developer  . Smokeless tobacco: Never Used  . Alcohol Use: No  . Drug Use: No  . Sexual Activity: Not on file   Other Topics Concern  . Not on file   Social History Narrative  . No narrative on file      Objective: BP 179/103  Pulse 89  Wt 145 lb (65.772 kg)  General: Alert and Oriented, No Acute Distress HEENT: Pupils equal, round, reactive to light. Conjunctivae clear.  Moist mucous membranes pharynx unremarkable Lungs: Clear and comfortable work of breathing Cardiac: Regular rate and rhythm.  Genitourinary: Uncircumcised. Foreskin, glans, shaft of penis all unremarkable without any gross or discoloration nor tenderness to palpation. Mental Status: No depression, anxiety, nor agitation. Skin: Warm and dry.  Assessment & Plan: Kenneth Lin was seen today for follow-up.  Diagnoses and associated orders for this visit:  Penile pain  Candidiasis of penis    Reassurance was provided that there is no visual suspicion of a return of any infection or abnormality to the penis.  I confirmed with the lab today that his fungal culture has been finalized, this was shared with the patient which provided him with a good deal of relief. I've advised him that no further intervention is needed and that any Sensation he is experiencing is not a reflection of any significant health consequence. Discussed routine hygiene, avoiding corticosteroids the penis. He wants to do as much as possible in the future to avoid future occurrences therefore it is ever on antibiotics in the future I be more than happy to provide him  with fluconazole to avoid a return of this highly anxiety provoking episode for him.   40 minutes spent face-to-face during visit today of which at least 50% was counseling or coordinating care regarding: 1. Penile pain   2. Candidiasis of penis      Return if symptoms worsen or fail to improve.

## 2014-08-18 NOTE — Telephone Encounter (Signed)
Asher MuirJamie, Will you please let Mr. Kenneth CongerBarker know that his most recent culture has not grown out any fungus or candida.  I think you were looking at the one he already knew about from last month.

## 2014-08-18 NOTE — Telephone Encounter (Signed)
Mr Kenneth Lin called concerning his infection. He states that the medicine he was started on 08/03/14 has not made much of a change. I told him that his last culture resulted "candida" and he was unhappy due to the fact he said that there are so many "candida" infections and he wanted the test redone. I told him that I would advise you of his situation and get back to him. Please advise. Corliss SkainsJamie Takelia Urieta, CMA

## 2014-08-19 ENCOUNTER — Telehealth: Payer: Self-pay | Admitting: *Deleted

## 2014-08-19 NOTE — Telephone Encounter (Signed)
Valium script faxed to pharmacy this morning. Corliss SkainsJamie Odies Desa, CMA

## 2014-09-01 ENCOUNTER — Other Ambulatory Visit: Payer: Self-pay | Admitting: Family Medicine

## 2014-09-20 ENCOUNTER — Encounter: Payer: Self-pay | Admitting: Emergency Medicine

## 2014-09-20 ENCOUNTER — Emergency Department
Admission: EM | Admit: 2014-09-20 | Discharge: 2014-09-20 | Disposition: A | Discharge status: Home / Self Care | Payer: Medicare Other | Source: Home / Self Care | Attending: Physician Assistant | Admitting: Physician Assistant

## 2014-09-20 DIAGNOSIS — B3742 Candidal balanitis: Secondary | ICD-10-CM

## 2014-09-20 DIAGNOSIS — N489 Disorder of penis, unspecified: Secondary | ICD-10-CM

## 2014-09-20 DIAGNOSIS — N4889 Other specified disorders of penis: Secondary | ICD-10-CM

## 2014-09-20 NOTE — ED Notes (Signed)
Pt c/o yeast infection on his penis.

## 2014-09-21 ENCOUNTER — Telehealth: Payer: Self-pay | Admitting: *Deleted

## 2014-09-21 NOTE — ED Provider Notes (Signed)
Agree with exam, assessment, and plan.   Johnny Latu A Kenlee Maler, MD 09/21/14 1838 

## 2014-09-21 NOTE — ED Provider Notes (Signed)
CSN: [id number]     Arrival date & time 09/20/14  1827 History   First MD Initiated Contact with Patient 09/20/14 1831     Chief Complaint  Patient presents with  . Rash   (Consider location/radiation/quality/duration/timing/severity/associated sxs/prior Treatment) HPI Pt presents to the clinic with ongoing penile pain under his uncircumcised foreskin. At one point culture was positive for yeast and he was treated multiple times with cream and oral tablets. He has since had numerous negative yeast cultures. He now complains on pain when he retracts his foreskin. He is not sexual activity. No penile drainage. No fever, chills, difficultly urinating. Not placed anything or foreskin or penis. Pain is usually only with retraction of foreskin.   Past Medical History  Diagnosis Date  . COPD (chronic obstructive pulmonary disease)   . Paroxysmal atrial fibrillation     Seeing [name] at [facility].    . Essential hypertension, benign   . Other and unspecified hyperlipidemia   . Peripheral artery disease     Left ABI of leg abnormal.     History reviewed. No pertinent past surgical history. Family History  Problem Relation Age of Onset  . Heart failure Mother   . Cancer Father    History  Substance Use Topics  . Smoking status: Former Games developer  . Smokeless tobacco: Never Used  . Alcohol Use: No    Review of Systems  All other systems reviewed and are negative.   Allergies  Gabapentin  Home Medications   Prior to Admission medications   Medication Sig Start Date End Date Taking? Authorizing Provider  albuterol (PROVENTIL HFA;VENTOLIN HFA) 108 (90 BASE) MCG/ACT inhaler Inhale 2 puffs into the lungs every 6 (six) hours as needed for wheezing. 07/22/14   Laren Boom, DO  aspirin 325 MG tablet Take 1 tablet (325 mg total) by mouth daily. 09/08/13   Lewayne Bunting, MD  budesonide-formoterol (SYMBICORT) 160-4.5 MCG/ACT inhaler Inhale 2 puffs into the lungs 2 (two) times daily. 06/17/14    Sean Hommel, DO  clobetasol cream (TEMOVATE) 0.05 % Apply 1 application topically 2 (two) times daily. For only two weeks. 07/29/14   Sean Hommel, DO  diazepam (VALIUM) 5 MG tablet TAKE 1 TABLET EVERY 12 HOURS AS NEEDED ANXIETY-MUST MAKE APPT. FOR FURTHER REFILLS 08/18/14   Laren Boom, DO  diltiazem (CARDIZEM CD) 180 MG 24 hr capsule Take 1 capsule (180 mg total) by mouth daily. 09/28/13   Laren Boom, DO  nystatin cream (MYCOSTATIN) Apply 1 application topically 2 (two) times daily. 07/23/14   Lajean Manes, MD  sildenafil (VIAGRA) 50 MG tablet Take 1 tablet (50 mg total) by mouth daily as needed for erectile dysfunction. 08/03/14   Sean Hommel, DO  SPIRIVA HANDIHALER 18 MCG inhalation capsule PLACE 1 CAPSULE (18 MCG TOTAL) INTO INHALER AND INHALE DAILY. 02/07/14   Sean Hommel, DO  zolpidem (AMBIEN) 5 MG tablet TAKE 1 TABLET BY MOUTH AT BEDTIME AS NEEDED FOR SLEEP 09/02/14   Sean Hommel, DO   BP 166/87  Pulse 83  Temp(Src) 98.1 F (36.7 C) (Oral)  Resp 16  Wt 147 lb (66.679 kg)  SpO2 99% Physical Exam  Constitutional: He appears well-developed and well-nourished.  Genitourinary:  uncircumcised penis. Retracted foreskin was negative for any discharge. In fact it was very dry and irritated appearance.     ED Course  Procedures (including critical care time) Labs Review Labs Reviewed  CULTURE, FUNGUS WITHOUT SMEAR    Imaging Review No results found.  MDM   1. Penile pain   2. Candidiasis of penis    Sterile fungal culture sent off but can take a few weeks to get results back.  I feel like pt could be "over cleaning" or "over drying". He is fairly obsessed about keeping area dry.  Discussed appears very dry and irritated today.  Encouraged cleaning only once a day and addition of placing some Vaseline over retracted foreskin to provide a little moisture. i explained that when something is dry it hurts with retraction.  Discussed briefly that circumcision could be a good option for him.  He will discussed with PCP. He is very hesitant about surgery for this.     Jomarie Longs, PA-C 09/21/14 [phone]

## 2014-09-21 NOTE — Telephone Encounter (Signed)
Pt walked in today with questions about his urgent care visit yesterday.  He wanted to know if he is to continue warm showers as usual and he wanted to know how long it would take for his culture to be resulted.  I advised him to yes, continue to shower like normal and that the culture could take 7-14 days.

## 2014-09-21 NOTE — Discharge Instructions (Signed)
Penis seems very dry.  Maybe keeping too dry.  Use Vaseline for next couple of days to see if helps with irritation.  Will call with culture.

## 2014-09-22 ENCOUNTER — Other Ambulatory Visit: Payer: Self-pay

## 2014-09-22 DIAGNOSIS — I480 Paroxysmal atrial fibrillation: Secondary | ICD-10-CM

## 2014-09-22 MED ORDER — DILTIAZEM HCL ER COATED BEADS 180 MG PO CP24: 180.0000 mg | ORAL_CAPSULE | Freq: Every day | ORAL | Status: DC

## 2014-09-23 ENCOUNTER — Telehealth: Payer: Self-pay | Admitting: *Deleted

## 2014-09-23 ENCOUNTER — Telehealth: Payer: Self-pay | Admitting: Family Medicine

## 2014-09-23 DIAGNOSIS — I480 Paroxysmal atrial fibrillation: Secondary | ICD-10-CM

## 2014-09-23 MED ORDER — DIAZEPAM 5 MG PO TABS: ORAL_TABLET | ORAL | Status: DC

## 2014-09-23 MED ORDER — DILTIAZEM HCL ER COATED BEADS 240 MG PO CP24: 240.0000 mg | ORAL_CAPSULE | Freq: Every day | ORAL | Status: DC

## 2014-09-23 NOTE — Telephone Encounter (Signed)
I would recommend increasing diltiazem to 240 mg to replace the 180 mg formulation in hopes of better controlling heart rate with a goal of a heart rate less than 100 beats per minute. A new prescription has been sent to [facility]

## 2014-09-23 NOTE — Telephone Encounter (Signed)
Mr. Berkovich called today stating that he is taking  of cardizem and then intermittently taking a  tablet when he experiences a racing heart. He said that over the last 2 weeks it is becoming more frequent that this medication is not controlling his symptoms. I explained to him that if he was experiencing SOB or chest pain along with these symptoms that he needed to seek medical attention. He evaded an answer but refuses to go to ER. He would like you to advise on changing his medications. Corliss Skains, CMA

## 2014-09-23 NOTE — Telephone Encounter (Signed)
[name], Rx placed in in-box ready for pickup/faxing.  

## 2014-09-23 NOTE — Telephone Encounter (Signed)
Yes, this was discussed with Marijean Niemann in person after she sent me the 330 phone note

## 2014-09-23 NOTE — Telephone Encounter (Signed)
Is he supposed to take the extra  if his heart rate does go above 100? Please advise. Corliss Skains, CMA

## 2014-09-23 NOTE — Telephone Encounter (Signed)
rx was faxed earlier

## 2014-09-23 NOTE — Telephone Encounter (Signed)
Pt called and states he has been trying for two days to get his Diazepam refilled to [facility] in [place].. Please call patient and let him know when this is ready. Thanks

## 2014-09-29 ENCOUNTER — Ambulatory Visit (INDEPENDENT_AMBULATORY_CARE_PROVIDER_SITE_OTHER): Payer: Medicare Other | Admitting: Family Medicine

## 2014-09-29 ENCOUNTER — Encounter: Payer: Self-pay | Admitting: Family Medicine

## 2014-09-29 VITALS — BP 160/85 | HR 74 | Wt 145.0 lb

## 2014-09-29 DIAGNOSIS — I480 Paroxysmal atrial fibrillation: Secondary | ICD-10-CM

## 2014-09-29 DIAGNOSIS — I10 Essential (primary) hypertension: Secondary | ICD-10-CM

## 2014-09-29 DIAGNOSIS — N4889 Other specified disorders of penis: Secondary | ICD-10-CM

## 2014-09-29 LAB — CULTURE, FUNGUS WITHOUT SMEAR: Organism ID, Bacteria: NO GROWTH

## 2014-09-29 MED ORDER — DILTIAZEM HCL 30 MG PO TABS: ORAL_TABLET | ORAL | Status: DC

## 2014-09-29 NOTE — Progress Notes (Signed)
CC: Kenneth Lin is a 80 y.o. male is here for discuss BP and heart rate   Subjective: HPI:  Followup atrial fibrillation: Currently he is taking 180 mg of long-acting diltiazem however over the last 2 weeks she's noticed on a daily basis that his heart rate can get as high as 115-120 while resting. With exertion it is never any greater than the above. If he takes an additional 30 mg of immediate release diltiazem heart rate while drop back down to below 100.  He denies any chest pain, motor sensory disturbances, shortness of breath nor orthopnea. Denies any peripheral edema. He was encouraged last week to switch to 240 mg of long-acting diltiazem however he has not done as yet due to fears it will be too potent.  Followup essential hypertension: He's been checking his blood pressure at home on a daily basis it's been consistently in the prehypertensive state never lower than 120/70 or greater than 140/90 regardless of how much diltiazem he's taken.  Followup penile pain: A little over a week ago he was seen in urgent care for continued penile pain on the right side of the foreskin/active his penis. Symptoms are mild in severity persistent on a daily basis improved greatly with Vaseline nothing else seems to make it better or worse. He states he sees a little spot at the site of his pain that's been there throughout the months of his discomfort.  Review Of Systems Outlined In HPI  Past Medical History  Diagnosis Date  . COPD (chronic obstructive pulmonary disease)   . Paroxysmal atrial fibrillation     Seeing [name] at [facility].    . Essential hypertension, benign   . Other and unspecified hyperlipidemia   . Peripheral artery disease     Left ABI of leg abnormal.      No past surgical history on file. Family History  Problem Relation Age of Onset  . Heart failure Mother   . Cancer Father     History   Social History  . Marital Status: Married    Spouse Name: N/A    Number of  Children: 2  . 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 Smoking status: Former Games developer  . Smokeless tobacco: Never Used  . Alcohol Use: No  . Drug Use: No  . Sexual Activity: Not on file   Other Topics Concern  . Not on file   Social History Narrative  . No narrative on file     Objective: BP 160/85  Pulse 74  Wt 145 lb (65.772 kg)  General: Alert and Oriented, No Acute Distress HEENT: Pupils equal, round, reactive to light. Conjunctivae clear.  Moist membranes pharynx unremarkable Lungs: Clear and comfortable work of breathing Cardiac: Irregularly irregular rhythm somewhere between 75-85 beats per minute Extremities: No peripheral edema.  Strong peripheral pulses.  Mental Status: No depression, anxiety, nor agitation. Skin: Warm and dry.  Assessment & Plan: Kenneth Lin was seen today for discuss bp and heart rate.  Diagnoses and associated orders for this visit:  Essential hypertension, benign - diltiazem (CARDIZEM) 30 MG tablet; One by mouth every 6 hours only as needed for pulse above 100bpm.  Paroxysmal atrial fibrillation - diltiazem (CARDIZEM) 30 MG tablet; One by mouth every 6 hours only as needed for pulse above 100bpm.  Penile pain    Essential hypertension: Uncontrolled, I suspect that this will slightly improved with his  increased dose of diltiazem described below Atrial fibrillation: Currently not rate control, I've encouraged him to go through with my recommendations from last week by increasing long-acting diltiazem to a dose of 240 mg daily and to only use supplemental 30 mg of immediate release formulation as needed Penile pain: Reviewed with patient that his fungal culture from last week was finalized and without any growth. Reassurance was provided that his pain is not coming from any infection and is likely that his skin/perception at this site is just more sensitive to normal friction. I've  encouraged him to continue using Vaseline or consider an evaluation for a circumcision however he's fearful that the circumcision will result in the need for antibiotics or pain medication that will do more harm than good  40 minutes spent face-to-faday of which at least 50% was counseling or coordinating care regarding: 1. Essential hypertension, benign   2. Paroxysmal atrial fibrillation   3. Penile pain       Return if symptoms worsen or fail to improve.

## 2014-10-03 ENCOUNTER — Telehealth: Payer: Self-pay | Admitting: Family Medicine

## 2014-10-03 NOTE — Telephone Encounter (Signed)
Mr. [name] made aware. Corliss SkainsJamie Coley Kulikowski, CMA

## 2014-10-03 NOTE — Telephone Encounter (Signed)
As long as he's not experiencing any chest pain, lightheadedness or dizziness at that pressure that reading is acceptable.

## 2014-10-03 NOTE — Telephone Encounter (Signed)
Mr. Kenneth Lin called. Bp med is lowering his bp to 105/60.  He has an appt tomorrow

## 2014-10-04 ENCOUNTER — Encounter: Payer: Self-pay | Admitting: Family Medicine

## 2014-10-04 ENCOUNTER — Ambulatory Visit (INDEPENDENT_AMBULATORY_CARE_PROVIDER_SITE_OTHER): Payer: Medicare Other | Admitting: Family Medicine

## 2014-10-04 VITALS — BP 161/89 | HR 99 | Wt 145.0 lb

## 2014-10-04 DIAGNOSIS — I10 Essential (primary) hypertension: Secondary | ICD-10-CM

## 2014-10-04 DIAGNOSIS — I480 Paroxysmal atrial fibrillation: Secondary | ICD-10-CM

## 2014-10-04 MED ORDER — DILTIAZEM HCL ER COATED BEADS 180 MG PO CP24: 180.0000 mg | ORAL_CAPSULE | Freq: Every day | ORAL | Status: DC

## 2014-10-04 NOTE — Patient Instructions (Signed)
Take a 30mg  diltiazem (immediate release) tomorrow morning then 180mg  (long acting) tomorrow evening.  From then on only take the 180mg  formulation at bedtime.

## 2014-10-04 NOTE — Progress Notes (Signed)
CC: Kenneth Lin is a 80 y.o. male is here for f/u BP   Subjective: HPI:  Presents for a early followup appointment due to his concerns about blood pressure and heart rate. At his last visit I encouraged him to start long-acting diltiazem at a dose of 240 mg daily. He decided not to do this and instead continued on 180 mg daily and has not needed any 30 mg immediate release formulation supplementation. He brings in blood pressures and pulses going back the last 4 days.  He's been taking his blood pressure and pulse 5 times every hour and brings in somewhere around 75 blood pressure and pulse readings.  Blood pressures are consistently in the stage I and stage II hypertensive range between 7 AM when he awakes and around 11 AM. All blood pressures beyond this time are in the normotensive range. His lowest blood pressure is recorded is one hundred and five over 62 and is concerned that he could be over medicating himself at 180 mg relation. He has no heart rates above 100 nor below 60.  Since I saw him last denies headache, lightheadedness, shortness of breath, cough, irregular heartbeat, chest pain or new motor or sensory disturbances.  He would like to talk more about his chronic penile pain which is unchanged since last visit with no new genitourinary complaints  Review Of Systems Outlined In HPI  Past Medical History  Diagnosis Date  . COPD (chronic obstructive pulmonary disease)   . Paroxysmal atrial fibrillation     Seeing [name] at [facility].    . Essential hypertension, benign   . Other and unspecified hyperlipidemia   . Peripheral artery disease     Left ABI of leg abnormal.      No past surgical history on file. Family History  Problem Relation Age of Onset  . Heart failure Mother   . Cancer Father     History   Social History  . Marital Status: Married    Spouse Name: N/A    Number of Children: 2  . 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 Smoking status: Former Games developer  . Smokeless tobacco: Never Used  . Alcohol Use: No  . Drug Use: No  . Sexual Activity: Not on file   Other Topics Concern  . Not on file   Social History Narrative  . No narrative on file     Objective: BP 161/89  Pulse 99  Wt 145 lb (65.772 kg)  Vital signs reviewed. General: Alert and Oriented, No Acute Distress HEENT: Pupils equal, round, reactive to light. Conjunctivae clear.  External ears unremarkable.  Moist mucous membranes. Lungs: Clear and comfortable work of breathing, speaking in full sentences without accessory muscle use. Cardiac: Irregularly irregular rhythm less than 100 beats per minute Neuro: CN II-XII grossly intact, gait normal. Extremities: No peripheral edema.  Strong peripheral pulses.  Mental Status: No depression, anxiety, nor agitation. Logical though process. Skin: Warm and dry.  Assessment & Plan: Adian was seen today for f/u bp.  Diagnoses and associated orders for this visit:  Essential hypertension, benign - Discontinue: diltiazem (CARDIZEM CD) 180 MG 24 hr capsule; Take 1 capsule (180 mg total) by mouth daily. - diltiazem (CARDIZEM CD) 180 MG 24 hr capsule; Take 1 capsule (180 mg total) by mouth daily.  Paroxysmal atrial fibrillation - Discontinue: diltiazem (CARDIZEM CD) 180 MG 24 hr capsule; Take 1  capsule (180 mg total) by mouth daily. - diltiazem (CARDIZEM CD) 180 MG 24 hr capsule; Take 1 capsule (180 mg total) by mouth daily.    Essential hypertension: Controlled morning values, I've advised him to change his dosing of 180 formulation of diltiazem to right when he goes to sleep at night and reassured him that it is normal physiology to have elevated blood pressures in the morning and relatively lower once in the evening. Please see patient instructions for dosing interval between now when he starts 180 mg every evening. Paroxysmal atrial fibrillation: Rate controlled no  change to the milligram dosing of his diltiazem. Penile pain: Reminded him of my recommendations from last visit that he has no evidence of infection additionally managed with either circumcision, using Vaseline, or ignoring the condition.  40 minutes spent face-to-face during visit today of which at least 50% was counseling or coordinating care regarding: 1. Essential hypertension, benign   2. Paroxysmal atrial fibrillation       Return in about 4 weeks (around 11/01/2014), or if symptoms worsen or fail to improve.

## 2014-10-07 ENCOUNTER — Other Ambulatory Visit: Payer: Self-pay | Admitting: *Deleted

## 2014-10-07 MED ORDER — ZOLPIDEM TARTRATE 5 MG PO TABS: ORAL_TABLET | ORAL | Status: DC

## 2014-10-23 ENCOUNTER — Other Ambulatory Visit: Payer: Self-pay | Admitting: Physician Assistant

## 2014-10-27 ENCOUNTER — Ambulatory Visit: Payer: Medicare Other

## 2014-11-06 ENCOUNTER — Other Ambulatory Visit: Payer: Self-pay | Admitting: Family Medicine

## 2014-11-16 ENCOUNTER — Other Ambulatory Visit: Payer: Self-pay | Admitting: Family Medicine

## 2014-12-04 ENCOUNTER — Other Ambulatory Visit: Payer: Self-pay | Admitting: Family Medicine

## 2014-12-08 ENCOUNTER — Ambulatory Visit: Payer: Medicare Other | Admitting: Family Medicine

## 2014-12-08 ENCOUNTER — Ambulatory Visit (INDEPENDENT_AMBULATORY_CARE_PROVIDER_SITE_OTHER): Payer: Medicare Other | Admitting: *Deleted

## 2014-12-08 ENCOUNTER — Encounter: Payer: Self-pay | Admitting: Family Medicine

## 2014-12-08 VITALS — Resp 20

## 2014-12-08 DIAGNOSIS — Z23 Encounter for immunization: Secondary | ICD-10-CM

## 2014-12-08 DIAGNOSIS — J441 Chronic obstructive pulmonary disease with (acute) exacerbation: Secondary | ICD-10-CM

## 2014-12-08 NOTE — Progress Notes (Signed)
   Subjective:    Patient ID: Kenneth Lin, male    DOB: 03/18/1930, 80 y.o.   MRN: [id number]  HPI  Pt is here for flu shot  Review of Systems     Objective:   Physical Exam        Assessment & Plan:

## 2014-12-14 ENCOUNTER — Other Ambulatory Visit: Payer: Self-pay

## 2014-12-14 MED ORDER — ALBUTEROL SULFATE HFA 108 (90 BASE) MCG/ACT IN AERS: 2.0000 | INHALATION_SPRAY | Freq: Four times a day (QID) | RESPIRATORY_TRACT | Status: DC | PRN

## 2014-12-15 ENCOUNTER — Other Ambulatory Visit: Payer: Self-pay | Admitting: Family Medicine

## 2014-12-15 MED ORDER — TIOTROPIUM BROMIDE MONOHYDRATE 18 MCG IN CAPS: 18.0000 ug | ORAL_CAPSULE | Freq: Every day | RESPIRATORY_TRACT | Status: DC

## 2014-12-15 MED ORDER — BUDESONIDE-FORMOTEROL FUMARATE 160-4.5 MCG/ACT IN AERO: 2.0000 | INHALATION_SPRAY | Freq: Two times a day (BID) | RESPIRATORY_TRACT | Status: DC

## 2014-12-15 NOTE — Addendum Note (Signed)
Addended by: Wyline BeadyMCCRIMMON, Shourya Macpherson C on: 12/15/2014 12:31 PM   Modules accepted: Orders

## 2014-12-31 ENCOUNTER — Other Ambulatory Visit: Payer: Self-pay | Admitting: Family Medicine

## 2015-01-05 ENCOUNTER — Other Ambulatory Visit: Payer: Self-pay | Admitting: Family Medicine

## 2015-01-06 ENCOUNTER — Ambulatory Visit (INDEPENDENT_AMBULATORY_CARE_PROVIDER_SITE_OTHER): Payer: Medicare Other | Admitting: Family Medicine

## 2015-01-06 ENCOUNTER — Encounter: Payer: Self-pay | Admitting: Family Medicine

## 2015-01-06 VITALS — BP 158/81 | HR 107 | Temp 98.3°F | Wt 139.0 lb

## 2015-01-06 DIAGNOSIS — J441 Chronic obstructive pulmonary disease with (acute) exacerbation: Secondary | ICD-10-CM

## 2015-01-06 DIAGNOSIS — J449 Chronic obstructive pulmonary disease, unspecified: Secondary | ICD-10-CM

## 2015-01-06 DIAGNOSIS — H6502 Acute serous otitis media, left ear: Secondary | ICD-10-CM

## 2015-01-06 DIAGNOSIS — I480 Paroxysmal atrial fibrillation: Secondary | ICD-10-CM

## 2015-01-06 MED ORDER — METHYLPREDNISOLONE SODIUM SUCC 125 MG IJ SOLR
125.0000 mg | Freq: Once | INTRAMUSCULAR | Status: AC
  Administered 2015-01-06: 125 mg via INTRAMUSCULAR

## 2015-01-06 MED ORDER — PREDNISONE 20 MG PO TABS: ORAL_TABLET | ORAL | Status: AC

## 2015-01-06 MED ORDER — PREDNISONE 20 MG PO TABS: ORAL_TABLET | ORAL | Status: DC

## 2015-01-06 MED ORDER — ZOLPIDEM TARTRATE 5 MG PO TABS: 5.0000 mg | ORAL_TABLET | Freq: Every evening | ORAL | Status: DC | PRN

## 2015-01-08 NOTE — Progress Notes (Signed)
CC: Kenneth Lin is a 81 y.o. male is here for hopsital f/u   Subjective: HPI:  Emergency room follow-up  Reports that sometime around Friday or Saturday of last week he began to have a mild cough that slowly progressed through the weekend only to rapidly deteriorate around Monday or Tuesday of the gain of the week. At that time he began to have increased sputum production all hours of the day described as green without any blood. It was accompanied by worsening wheezing despite using albuterol throughout the day. He has been using Symbicort and Spiriva as prescribed. Symptoms became moderate to severe in severity and he went to the [facility] emergency room. He had a normal chest x-ray and a normal white blood cell count. All other labs were unremarkable. He was sent home with a prednisone taper however he states he does not feel like he is any bit better. On top of this the day after he was discharged he experienced sudden hearing decline in both ears. He has artery seen his ear nose and throat specialist after this occurred and was prescribed a nasal corticosteroid and Afrin. He's been using this regimen for just about 24 hours without any improvement of his hearing symptoms. Review of systems is positive for nasal congestion and postnasal drip. He has been checking his pulse oximetry at home and denies any decline below 92%.  He's noticed that since being on steroids his pulse has been for the most part over 100 bpm. He denies any chest pain, motor or sensory disturbances but he has had mild shortness of breath since pulmonary symptoms above occurred.    Denies chest pain, new back pain, confusion, nor headache.  Review Of Systems Outlined In HPI  Past Medical History  Diagnosis Date  . COPD (chronic obstructive pulmonary disease)   . Paroxysmal atrial fibrillation     Seeing [name] at [facility].    . Essential hypertension, benign   . Other and unspecified hyperlipidemia   .  Peripheral artery disease     Left ABI of leg abnormal.      No past surgical history on file. Family History  Problem Relation Age of Onset  . Heart failure Mother   . Cancer Father     History   Social History  . Marital Status: Married    Spouse Name: N/A    Number of Children: 2  . 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 Smoking status: Former Games developer  . Smokeless tobacco: Never Used  . Alcohol Use: No  . Drug Use: No  . Sexual Activity: Not on file   Other Topics Concern  . Not on file   Social History Narrative     Objective: BP 158/81 mmHg  Pulse 107  Temp(Src) 98.3 F (36.8 C) (Oral)  Wt 139 lb (63.05 kg)  General: Alert and Oriented, No Acute Distress HEENT: Pupils equal, round, reactive to light. Conjunctivae clear.  External ears unremarkable, canals clear with intact TMs with appropriate landmarks.  Middle ear appears to have a moderate serous effusionleft greater than right. Pink inferior turbinates.  Moist mucous membranes, pharynx without inflammation nor lesions.  Neck supple without palpable lymphadenopathy nor abnormal masses. Lungs: comfortable work of breathing with mild end expiratory wheezing in all lung fields. No rhonchi or rales. Cardiac: Irregularly irregular rhythm just slightly above 100 bpm Extremities: No peripheral edema.  Strong peripheral  pulses.  Mental Status: No depression, anxiety, nor agitation. Skin: Warm and dry.  Assessment & Plan: [name] was seen today for hopsital f/u.  Diagnoses and associated orders for this visit:  COPD, moderate - methylPREDNISolone sodium succinate (SOLU-MEDROL) 125 mg/2 mL injection 125 mg; Inject 2 mLs (125 mg total) into the muscle once.  COPD exacerbation - methylPREDNISolone sodium succinate (SOLU-MEDROL) 125 mg/2 mL injection 125 mg; Inject 2 mLs (125 mg total) into the muscle once.  Acute serous otitis media of left ear,  recurrence not specified  Paroxysmal atrial fibrillation  Other Orders - Discontinue: predniSONE (DELTASONE) 20 MG tablet; Four tabs daily days 1-3, three tabs daily days 4-6, two tab daily days 7-9, one tab daily days 10-12, half tab daily until finished. - zolpidem (AMBIEN) 5 MG tablet; Take 1 tablet (5 mg total) by mouth at bedtime as needed. for sleep - predniSONE (DELTASONE) 20 MG tablet; Four tabs daily days 1-3, three tabs daily days 4-6, two tab daily days 7-9, one tab daily days 10-12, half tab daily until finished.    COPD exacerbation: It sounds like there is been relatively little improvement since starting on prednisone prescribed in the emergency room. I like him to restart his prednisone taper and he'll also receive Solu-Medrol injection here today. I encouraged him to start on antibiotics such as azithromycin for doxycycline however he adamantly declines due to fears of this causing a return of his penile candidiasis. I've asked him to follow-up with me early next week. Atrial fibrillation: We discussed a regimen of continuing onhis daily 180 mglong-acting diltiazem and a additional 30 mg immediate release tablet every 6 hours only as needed for pulse above 100 bpm Otitis media: discussed that Afrin and nasal corticosteroid combination will likely take at least 48 hours before he notices any effect with his hearing, encouraged to follow up with ear nose and throat if no better by next week.  I literally had to shout at him for the entire encounter for him to hear me having to repeat myself multiple times with questioning and explanations.  40 minutes spent face-to-face during visit today of which at least 50% was counseling or coordinating care regarding: 1. COPD, moderate   2. COPD exacerbation   3. Acute serous otitis media of left ear, recurrence not specified   4. Paroxysmal atrial fibrillation      Return for Tuesday for breathing..Marland Kitchen

## 2015-01-10 ENCOUNTER — Ambulatory Visit: Payer: Self-pay | Admitting: Family Medicine

## 2015-01-13 ENCOUNTER — Ambulatory Visit (INDEPENDENT_AMBULATORY_CARE_PROVIDER_SITE_OTHER): Payer: Medicare Other | Admitting: Family Medicine

## 2015-01-13 ENCOUNTER — Encounter: Payer: Self-pay | Admitting: Family Medicine

## 2015-01-13 VITALS — BP 141/84 | HR 92 | Temp 97.8°F | Wt 134.0 lb

## 2015-01-13 DIAGNOSIS — B370 Candidal stomatitis: Secondary | ICD-10-CM

## 2015-01-13 DIAGNOSIS — I480 Paroxysmal atrial fibrillation: Secondary | ICD-10-CM

## 2015-01-13 NOTE — Progress Notes (Signed)
CC: Kenneth Lin is a 81 y.o. male is here for 1 week f/u   Subjective: HPI:  White film found in his mouth and on his lips that began on Wednesday of this week. Initially the inside of the mouth was tender and sensitive to warm beverages however this is now resolved. Interventions have included nystatin swish and spit at least 4 times a day since Wednesday afternoon. He's noticed that the white film has improved inside the mouth however it is persistent on the lips. It will improve for a few hours after applying nystatin however comes back to a moderate degree. He also started fluconazole last night and has noticed that symptoms are a little better this morning. He is worried that the white film his skin and mucosa that is shedding off of his lips and cheeks. Denies fevers, chills, dysphagia, cough, wheezing, facial pressure, motor or sensory disturbances.    Follow-up atrial fibrillation: Since being on prednisone he has had to take an additional 30 mg of diltiazem, immediate release. He's noticed that if he only takes 180 mg of diltiazem long-acting he will have a pulse that ranges from 100-115. Pulse will remain below 100 bpm if he takes the additional 30 mg at bedtime described above. Denies chest pain, orthopnea, nor lightheadedness.  He tells me that since I saw him last his hearing has greatly improved subjectively.   Review Of Systems Outlined In HPI  Past Medical History  Diagnosis Date  . COPD (chronic obstructive pulmonary disease)   . Paroxysmal atrial fibrillation     Seeing [name] at [facility].    . Essential hypertension, benign   . Other and unspecified hyperlipidemia   . Peripheral artery disease     Left ABI of leg abnormal.      No past surgical history on file. Family History  Problem Relation Age of Onset  . Heart failure Mother   . Cancer Father     History   Social History  . Marital Status: Married    Spouse Name: N/A    Number of Children: 2  . 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 Smoking status: Former Games developer  . Smokeless tobacco: Never Used  . Alcohol Use: No  . Drug Use: No  . Sexual Activity: Not on file   Other Topics Concern  . Not on file   Social History Narrative     Objective: BP 141/84 mmHg  Pulse 92  Temp(Src) 97.8 F (36.6 C) (Oral)  Wt 134 lb (60.782 kg)  SpO2 95%  General: Alert and Oriented, No Acute Distress HEENT: Pupils equal, round, reactive to light. Conjunctivae clear.  Moist mucous membranes, scant white film on the soft palate and tongue, moderate white film with sticky buildup on the lips. Lungs: Clear to auscultation bilaterally, no wheezing/ronchi/rales.  Comfortable work of breathing. Good air movement. Cardiac: Irregularly irregular rhythm at a rate of less than 100 bpm without murmurs or rubs Extremities: No peripheral edema.  Strong peripheral pulses.  Mental Status: No depression, anxiety, nor agitation. Skin: Warm and dry.  Assessment & Plan: Kenneth Lin was seen today for 1 week f/u.  Diagnoses and associated orders for this visit:  Thrush  Paroxysmal atrial fibrillation    Thrush: Reassurance was provided that this is a common reaction/side effects for people who are on prednisone. I've encouraged him to continue nystatin mouthwash 4 times a day  and continue fluconazole 200 mg daily, he is to finish the eighth tablets of fluconazole has at home. If symptoms persist after prednisone next step would be clotrimazole troches. Paroxysmal atrial fibrillation: Reminded him that he'll likely need to take an additional 30 mg of the immediate release formulation of diltiazem until he is done with prednisone.  Return if symptoms worsen or fail to improve.

## 2015-01-27 ENCOUNTER — Other Ambulatory Visit: Payer: Self-pay | Admitting: Family Medicine

## 2015-01-27 ENCOUNTER — Telehealth: Payer: Self-pay | Admitting: *Deleted

## 2015-01-27 MED ORDER — CLOTRIMAZOLE 10 MG MT TROC: OROMUCOSAL | Status: DC

## 2015-01-27 NOTE — Telephone Encounter (Signed)
Pt called and states the thrush has returned. He has finished prednisone, has done nystatin and has finished the diflucan tablets he had at home. The thush had gone away but has now come back. I advised pt per last note that Dr. Ivan AnchorsHommel could send in clotrimizole.

## 2015-01-27 NOTE — Telephone Encounter (Signed)
Kenneth Lin, Rx has been sent to [facility]

## 2015-02-07 ENCOUNTER — Telehealth: Payer: Self-pay | Admitting: Family Medicine

## 2015-02-07 NOTE — Telephone Encounter (Signed)
Patient called adv that he has an appt tomorrow to see Dr. Rexene EdisonH and after 18/19 days of 3 different kind of medicines trying to treat it, nothing is working and its still on his lips & tongue. He just wanted to let Dr. Rexene EdisonH know before his appt tomorrow. Thanks

## 2015-02-08 ENCOUNTER — Ambulatory Visit (INDEPENDENT_AMBULATORY_CARE_PROVIDER_SITE_OTHER): Payer: Medicare Other | Admitting: Family Medicine

## 2015-02-08 ENCOUNTER — Encounter: Payer: Self-pay | Admitting: Family Medicine

## 2015-02-08 VITALS — BP 159/87 | HR 79 | Wt 132.0 lb

## 2015-02-08 DIAGNOSIS — B370 Candidal stomatitis: Secondary | ICD-10-CM

## 2015-02-08 MED ORDER — FLUCONAZOLE 200 MG PO TABS: 200.0000 mg | ORAL_TABLET | Freq: Every day | ORAL | Status: DC

## 2015-02-08 MED ORDER — TERBINAFINE HCL 250 MG PO TABS: 250.0000 mg | ORAL_TABLET | Freq: Every day | ORAL | Status: DC

## 2015-02-08 NOTE — Progress Notes (Signed)
CC: Kenneth Lin is a 81 y.o. male is here for f/u mouth infection   Subjective: HPI:  Complains of a white film on his tongue and on the lips that has been present since the middle of January. Interventions have included fluconazole which was mildly effective, nystatin which is mildly effective, and clotrimazole troches which has also been mildly but not fully effective. He tried stopping the structures and nystatin, both of which he is using on a daily basis as prescribed, 2 days ago and noticed the whitening spread from the tongue to the lips within a matter of hours. His chief complaint is painless. He denies any skin changes elsewhere. He uses Symbicort but washes his mouth out after every inhalation. He's also been soaking his dentures in chlorhexidine. He's been doing incredible amount of research on thrush and wants to know if it's true that he should avoid all sugar in his diet. He is also read that caffeine encourages the growth thrush and he is now cut out caffeine in his diet. He denies dysphagia, regurgitation, fevers, chills,nor any gastrointestinal complaints.   Review Of Systems Outlined In HPI  Past Medical History  Diagnosis Date  . COPD (chronic obstructive pulmonary disease)   . Paroxysmal atrial fibrillation     Seeing [name] at [facility].    . Essential hypertension, benign   . Other and unspecified hyperlipidemia   . Peripheral artery disease     Left ABI of leg abnormal.      No past surgical history on file. Family History  Problem Relation Age of Onset  . Heart failure Mother   . Cancer Father     History   Social History  . Marital Status: Married    Spouse Name: N/A  . Number of Children: 2  . 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 Smoking status: Former Games developer  . Smokeless tobacco: Never Used  . Alcohol Use: No  . Drug Use: No  . Sexual Activity: Not on file   Other Topics Concern   . Not on file   Social History Narrative     Objective: BP 159/87 mmHg  Pulse 79  Wt 132 lb (59.875 kg)  General: Alert and Oriented, No Acute Distress HEENT: Pupils equal, round, reactive to light. Conjunctivae clear.  Moist mucous membranes, no abnormal findings of his lips or buccal mucosa. He has a mild white film on the back of his tongue his pharynx is without lesions or inflammation. Lungs: clinical worker breathing Cardiac: Regular rate and rhythm.  Extremities: No peripheral edema.  Strong peripheral pulses.  Mental Status: No depression, anxiety, nor agitation. Skin: Warm and dry.  Assessment & Plan: Rucker was seen today for f/u mouth infection.  Diagnoses and all orders for this visit:  Thrush Orders: -     CBC w/Diff -     fluconazole (DIFLUCAN) 200 MG tablet; Take 1 tablet (200 mg total) by mouth daily. -     terbinafine (LAMISIL) 250 MG tablet; Take 1 tablet (250 mg total) by mouth daily. -     Culture, fungus without smear   Thrush: Checking white blood cell count to ensure that he is not leukopenic. Overall looks much better than when I saw him last but there is still an element of thrush which seems odd after all of his treatment. We obtained a fungal culture today to ensure that that truly  what were dealing with in the meantime start fluconazole and terbinafine on a daily basis pending results. Start probiotics. .A considerable amount of time was taken to listen to his concerns regarding dietary modifications, I've advised him that I think it's safe for him to continue drinking coffee and to minimize sugar in his diet but he doesn't need to remove it 100%.  40 minutes spent face-to-face during visit today of which at least 50% was counseling or coordinating care regarding: 1. Thrush      Return if symptoms worsen or fail to improve.

## 2015-02-09 LAB — CBC WITH DIFFERENTIAL/PLATELET
Basophils Absolute: 0 10*3/uL (ref 0.0–0.1)
Basophils Relative: 0 % (ref 0–1)
Eosinophils Absolute: 0 10*3/uL (ref 0.0–0.7)
Eosinophils Relative: 1 % (ref 0–5)
HEMATOCRIT: 34.3 % — AB (ref 39.0–52.0)
HEMOGLOBIN: 10.9 g/dL — AB (ref 13.0–17.0)
LYMPHS ABS: 0.6 10*3/uL — AB (ref 0.7–4.0)
Lymphocytes Relative: 13 % (ref 12–46)
MCH: 28 pg (ref 26.0–34.0)
MCHC: 31.8 g/dL (ref 30.0–36.0)
MCV: 88.2 fL (ref 78.0–100.0)
MONO ABS: 0.4 10*3/uL (ref 0.1–1.0)
MPV: 11.2 fL (ref 8.6–12.4)
Monocytes Relative: 9 % (ref 3–12)
NEUTROS PCT: 77 % (ref 43–77)
Neutro Abs: 3.7 10*3/uL (ref 1.7–7.7)
Platelets: 379 10*3/uL (ref 150–400)
RBC: 3.89 MIL/uL — ABNORMAL LOW (ref 4.22–5.81)
RDW: 15.1 % (ref 11.5–15.5)
WBC: 4.8 10*3/uL (ref 4.0–10.5)

## 2015-02-14 ENCOUNTER — Other Ambulatory Visit: Payer: Self-pay | Admitting: Family Medicine

## 2015-02-17 LAB — CULTURE, FUNGUS WITHOUT SMEAR: Organism ID, Bacteria: NO GROWTH

## 2015-02-22 ENCOUNTER — Encounter: Payer: Self-pay | Admitting: Family Medicine

## 2015-02-22 ENCOUNTER — Ambulatory Visit (INDEPENDENT_AMBULATORY_CARE_PROVIDER_SITE_OTHER): Payer: Medicare Other | Admitting: Family Medicine

## 2015-02-22 ENCOUNTER — Ambulatory Visit: Payer: Medicare Other | Admitting: Family Medicine

## 2015-02-22 VITALS — BP 157/96 | HR 94 | Wt 134.0 lb

## 2015-02-22 DIAGNOSIS — R600 Localized edema: Secondary | ICD-10-CM

## 2015-02-22 DIAGNOSIS — R634 Abnormal weight loss: Secondary | ICD-10-CM | POA: Insufficient documentation

## 2015-02-22 DIAGNOSIS — K1370 Unspecified lesions of oral mucosa: Secondary | ICD-10-CM

## 2015-02-22 MED ORDER — FUROSEMIDE 20 MG PO TABS: 20.0000 mg | ORAL_TABLET | Freq: Every day | ORAL | Status: DC | PRN

## 2015-02-22 NOTE — Progress Notes (Signed)
CC: Kenneth ModenaRonald Lin is a 81 y.o. male is here for white tongue   Subjective: HPI:  Patient has been on terbinafine and fluconazole on a daily basis for the last 2 weeks now. A fungal culture of his mouth was -2 weeks ago. He is also using nystatin in his mouth and lips to combat continued white patches on his tongue and white buildup on his lips. Interestingly symptoms are absent from the time he goes to bed to when he wakes up however from 7 AM until 9 will begin to Amerge on the lips and on the tongue. He will use nystatin on the lips and in his mouth and symptoms were resided for about 3 hours only to return. Nothing else seems to make better or worse. It is painless. He thinks that symptoms overall are slightly improving but still present on a daily basis. Denies difficulty swallowing or pain in the back of his throat. Denies any skin changes elsewhere. Denies fevers, chills or swollen lymph nodes.  Continues to have unintentional weight loss and over the past 2 months has had to put many notches and his belt to keep his pants up. He tells me that his appetite has picked up and he's gained some weight since I saw him last however he's also noticed some lower extremity swelling which is mild to moderate in severity and worse at the end of the day better first thing in the morning. He denies chest pain or heart rate above 100 bpm. Denies orthopnea or PND. No diarrhea, constipation, nausea or vomiting nor abdominal pain   Review Of Systems Outlined In HPI  Past Medical History  Diagnosis Date  . COPD (chronic obstructive pulmonary disease)   . Paroxysmal atrial fibrillation     [facility]    . Essential hypertension, benign   . Other and unspecified hyperlipidemia   . Peripheral artery disease     Left ABI of leg abnormal.      No past surgical history on file. Family History  Problem Relation Age of Onset  . Heart failure Mother   . Cancer Father     History   Social  History  . Marital Status: Married    Spouse Name: N/A  . Number of Children: 2  . 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 Smoking status: Former Games developermoker  . Smokeless tobacco: Never Used  . Alcohol Use: No  . Drug Use: No  . Sexual Activity: Not on file   Other Topics Concern  . Not on file   Social History Narrative     Objective: BP 157/96 mmHg  Pulse 94  Wt 134 lb (60.782 kg)  General: Alert and Oriented, No Acute Distress HEENT: Pupils equal, round, reactive to light. Conjunctivae clear.  External ears unremarkable, canals clear with intact TMs with appropriate landmarks.  Middle ear appears open without effusion. Pink inferior turbinates.  Moist mucous membranes, pharynx unremarkable does have a white patch on his tongue which is not removed with scraping he also has some mild white wet buildup in the corners of his mouth and on the lateral upper and lower lip..  Neck supple without palpable lymphadenopathy nor abnormal masses.  Lungs: Clear to auscultation bilaterally, no wheezing/ronchi/rales.  Comfortable work of breathing. Good air movement. Cardiac: Irregularly irregular heartbeat less than 100 bpm without murmur Extremities: 1+ pitting edema bilaterally from the ankles  distally  Strong peripheral pulses.  Mental Status: No depression, anxiety, nor agitation. Skin: Warm and dry.  Assessment & Plan: Kaylob was seen today for white tongue.  Diagnoses and all orders for this visit:  Unintentional weight loss Orders: -     PSA -     HIV antibody  Mouth lesion Orders: -     Ambulatory referral to ENT  Bilateral edema of lower extremity  Other orders -     Discontinue: furosemide (LASIX) 20 MG tablet; Take 1 tablet (20 mg total) by mouth daily as needed for fluid (in the legs). -     furosemide (LASIX) 20 MG tablet; Take 1 tablet (20 mg total) by mouth daily as needed for fluid (in the  legs).   Unintentional weight loss: Given stool cards today to screen for colon cancer, PSA obtained to screen for prostate cancer. Checking HIV given weight loss and unexplained white patches in the mouth. Mouth lesion: Informed him that I am at a loss right now to explain why this white buildup has persisted despite nystatin, clotrimazole, Lamisil and fluconazole. Discussed that there is no point in starting any of these medications given his negative fungal culture and I recommended that he see ENT to see if they can offer any opinion on the matter. Bilateral lower extremity edema: Begin as needed Lasix.  40 minutes spent face-to-face during visit today of which at least 50% was counseling or coordinating care regarding: 1. Unintentional weight loss   2. Mouth lesion   3. Bilateral edema of lower extremity       Return if symptoms worsen or fail to improve.

## 2015-02-23 ENCOUNTER — Ambulatory Visit: Payer: Medicare Other | Admitting: Family Medicine

## 2015-02-23 LAB — PSA: PSA: 2.17 ng/mL (ref <=4.00)

## 2015-02-23 LAB — HIV ANTIBODY (ROUTINE TESTING W REFLEX): HIV: NONREACTIVE

## 2015-03-02 ENCOUNTER — Encounter: Payer: Self-pay | Admitting: Family Medicine

## 2015-03-02 DIAGNOSIS — K1370 Unspecified lesions of oral mucosa: Secondary | ICD-10-CM | POA: Insufficient documentation

## 2015-03-08 ENCOUNTER — Other Ambulatory Visit: Payer: Self-pay

## 2015-03-08 ENCOUNTER — Other Ambulatory Visit: Payer: Self-pay | Admitting: Family Medicine

## 2015-03-08 DIAGNOSIS — R634 Abnormal weight loss: Secondary | ICD-10-CM

## 2015-03-08 LAB — POC HEMOCCULT BLD/STL (HOME/3-CARD/SCREEN)
Card #2 Fecal Occult Blod, POC: NEGATIVE
FECAL OCCULT BLD: NEGATIVE
FECAL OCCULT BLD: NEGATIVE

## 2015-03-18 ENCOUNTER — Other Ambulatory Visit: Payer: Self-pay | Admitting: Family Medicine

## 2015-04-10 ENCOUNTER — Other Ambulatory Visit: Payer: Self-pay | Admitting: Family Medicine

## 2015-04-11 ENCOUNTER — Other Ambulatory Visit: Payer: Self-pay | Admitting: Family Medicine

## 2015-04-22 ENCOUNTER — Other Ambulatory Visit: Payer: Self-pay | Admitting: Family Medicine

## 2015-06-01 ENCOUNTER — Other Ambulatory Visit: Payer: Self-pay | Admitting: Family Medicine

## 2015-06-12 ENCOUNTER — Telehealth: Payer: Self-pay | Admitting: *Deleted

## 2015-06-12 DIAGNOSIS — R42 Dizziness and giddiness: Secondary | ICD-10-CM

## 2015-06-12 DIAGNOSIS — R531 Weakness: Secondary | ICD-10-CM

## 2015-06-12 NOTE — Telephone Encounter (Signed)
Pt called stating that he has been experiencing some weakness & slight dizziness.  He also stated that he's been out of his iron for a few weeks.  Ordered labs & sent him to [facility].

## 2015-06-13 LAB — CBC WITH DIFFERENTIAL/PLATELET
BASOS PCT: 1 % (ref 0–1)
Basophils Absolute: 0.1 10*3/uL (ref 0.0–0.1)
EOS ABS: 0.4 10*3/uL (ref 0.0–0.7)
Eosinophils Relative: 4 % (ref 0–5)
HCT: 36.4 % — ABNORMAL LOW (ref 39.0–52.0)
HEMOGLOBIN: 12 g/dL — AB (ref 13.0–17.0)
LYMPHS ABS: 0.7 10*3/uL (ref 0.7–4.0)
Lymphocytes Relative: 8 % — ABNORMAL LOW (ref 12–46)
MCH: 27.8 pg (ref 26.0–34.0)
MCHC: 33 g/dL (ref 30.0–36.0)
MCV: 84.5 fL (ref 78.0–100.0)
MPV: 11.7 fL (ref 8.6–12.4)
Monocytes Absolute: 0.7 10*3/uL (ref 0.1–1.0)
Monocytes Relative: 8 % (ref 3–12)
NEUTROS PCT: 79 % — AB (ref 43–77)
Neutro Abs: 7 10*3/uL (ref 1.7–7.7)
Platelets: 352 10*3/uL (ref 150–400)
RBC: 4.31 MIL/uL (ref 4.22–5.81)
RDW: 16.3 % — AB (ref 11.5–15.5)
WBC: 8.8 10*3/uL (ref 4.0–10.5)

## 2015-06-13 LAB — IRON AND TIBC
%SAT: 16 % — ABNORMAL LOW (ref 20–55)
Iron: 41 ug/dL — ABNORMAL LOW (ref 42–165)
TIBC: 250 ug/dL (ref 215–435)
UIBC: 209 ug/dL (ref 125–400)

## 2015-06-13 LAB — FERRITIN: FERRITIN: 220 ng/mL (ref 22–322)

## 2015-06-20 ENCOUNTER — Other Ambulatory Visit: Payer: Self-pay | Admitting: Family Medicine

## 2015-06-21 ENCOUNTER — Ambulatory Visit: Payer: Medicare Other | Admitting: Family Medicine

## 2015-06-24 ENCOUNTER — Other Ambulatory Visit: Payer: Self-pay | Admitting: Family Medicine

## 2015-07-04 ENCOUNTER — Other Ambulatory Visit: Payer: Self-pay | Admitting: Family Medicine

## 2015-07-04 DIAGNOSIS — D649 Anemia, unspecified: Secondary | ICD-10-CM

## 2015-07-05 ENCOUNTER — Other Ambulatory Visit: Payer: Self-pay | Admitting: Family Medicine

## 2015-07-05 ENCOUNTER — Other Ambulatory Visit: Payer: Self-pay | Admitting: *Deleted

## 2015-07-05 ENCOUNTER — Telehealth: Payer: Self-pay | Admitting: *Deleted

## 2015-07-05 DIAGNOSIS — E611 Iron deficiency: Secondary | ICD-10-CM

## 2015-07-05 LAB — IRON AND TIBC
%SAT: 16 % — AB (ref 20–55)
Iron: 52 ug/dL (ref 42–165)
TIBC: 330 ug/dL (ref 215–435)
UIBC: 278 ug/dL (ref 125–400)

## 2015-07-05 MED ORDER — NYSTATIN 100000 UNIT/ML MT SUSP: OROMUCOSAL | Status: DC

## 2015-07-05 NOTE — Telephone Encounter (Signed)
Pt request magic mouthwash called into [facility]

## 2015-07-14 ENCOUNTER — Other Ambulatory Visit: Payer: Self-pay | Admitting: Family Medicine

## 2015-07-19 ENCOUNTER — Other Ambulatory Visit: Payer: Self-pay | Admitting: Family Medicine

## 2015-08-16 ENCOUNTER — Other Ambulatory Visit: Payer: Self-pay | Admitting: Family Medicine

## 2015-08-16 MED ORDER — DILTIAZEM HCL 30 MG PO TABS: ORAL_TABLET | ORAL | Status: DC

## 2015-08-16 MED ORDER — DIAZEPAM 5 MG PO TABS: ORAL_TABLET | ORAL | Status: DC

## 2015-08-16 NOTE — Telephone Encounter (Signed)
Patient advised he must keep follow up appointment scheduled with Provider

## 2015-08-16 NOTE — Addendum Note (Signed)
Addended by: Collie Siad on: 08/16/2015 04:57 PM   Modules accepted: Orders

## 2015-08-21 ENCOUNTER — Ambulatory Visit: Payer: Medicare Other | Admitting: Family Medicine

## 2015-08-21 ENCOUNTER — Other Ambulatory Visit: Payer: Self-pay | Admitting: Family Medicine

## 2015-08-21 DIAGNOSIS — E611 Iron deficiency: Secondary | ICD-10-CM

## 2015-08-22 LAB — CBC WITH DIFFERENTIAL/PLATELET
BASOS ABS: 0 10*3/uL (ref 0.0–0.1)
BASOS PCT: 0 % (ref 0–1)
Eosinophils Absolute: 0.1 10*3/uL (ref 0.0–0.7)
Eosinophils Relative: 1 % (ref 0–5)
HEMATOCRIT: 42.6 % (ref 39.0–52.0)
HEMOGLOBIN: 14.4 g/dL (ref 13.0–17.0)
LYMPHS PCT: 9 % — AB (ref 12–46)
Lymphs Abs: 0.6 10*3/uL — ABNORMAL LOW (ref 0.7–4.0)
MCH: 29.9 pg (ref 26.0–34.0)
MCHC: 33.8 g/dL (ref 30.0–36.0)
MCV: 88.6 fL (ref 78.0–100.0)
MPV: 13.4 fL — ABNORMAL HIGH (ref 8.6–12.4)
Monocytes Absolute: 0.3 10*3/uL (ref 0.1–1.0)
Monocytes Relative: 4 % (ref 3–12)
NEUTROS ABS: 6 10*3/uL (ref 1.7–7.7)
NEUTROS PCT: 86 % — AB (ref 43–77)
Platelets: 206 10*3/uL (ref 150–400)
RBC: 4.81 MIL/uL (ref 4.22–5.81)
RDW: 15.7 % — ABNORMAL HIGH (ref 11.5–15.5)
WBC: 7 10*3/uL (ref 4.0–10.5)

## 2015-08-22 LAB — IRON AND TIBC
%SAT: 19 % — AB (ref 20–55)
Iron: 54 ug/dL (ref 42–165)
TIBC: 291 ug/dL (ref 215–435)
UIBC: 237 ug/dL (ref 125–400)

## 2015-08-23 ENCOUNTER — Encounter: Payer: Self-pay | Admitting: Family Medicine

## 2015-08-23 ENCOUNTER — Ambulatory Visit (INDEPENDENT_AMBULATORY_CARE_PROVIDER_SITE_OTHER): Payer: Medicare Other | Admitting: Family Medicine

## 2015-08-23 VITALS — BP 164/80 | HR 78 | Wt 142.0 lb

## 2015-08-23 DIAGNOSIS — D509 Iron deficiency anemia, unspecified: Secondary | ICD-10-CM | POA: Insufficient documentation

## 2015-08-23 DIAGNOSIS — R600 Localized edema: Secondary | ICD-10-CM | POA: Diagnosis not present

## 2015-08-24 MED ORDER — BUDESONIDE-FORMOTEROL FUMARATE 160-4.5 MCG/ACT IN AERO: INHALATION_SPRAY | RESPIRATORY_TRACT | Status: DC

## 2015-08-24 NOTE — Progress Notes (Signed)
CC: Kenneth Lin is a 81 y.o. male is here for f/u labs and Hypertension   Subjective: HPI:  Follow-up iron deficiency anemia: He is taking a single iron capsule twice a day. He does not like taking this because he is worried it could cause his stomach to be upset because currently denies any abdominal discomfort. He's had a nice increase in his iron stores and his hemoglobin. Both her back in the normal range now. He like to go over this blood work. He starting to gain weight again and feels like he's got a little bit of his energy back. He is happy with his response so far but really doesn't like the idea of taking this medication.  Follow-up ankle edema: Since starting as needed Lasix is noted no longer having any bilateral ankle edema. In fact he's not had to take any in the last few weeks. He denies any orthopnea nor edema elsewhere. No chest pain or shortness of breath.  His major complaint today is the possibility that he might have Addison's disease. He's been doing some research online and noticed that this can be associated with chronic fatigue. Although his fatigue is improving he is worried he might have this condition. He denies any history of hypotension or recent electrolyte abnormality. He has some darkening on the back of his hands and is worried that this is caused by Addison's disease. He has no family history of this condition.    Review Of Systems Outlined In HPI  Past Medical History  Diagnosis Date  . COPD (chronic obstructive pulmonary disease)   . Paroxysmal atrial fibrillation     Seeing [name] at [facility].    . Essential hypertension, benign   . Other and unspecified hyperlipidemia   . Peripheral artery disease     Left ABI of leg abnormal.      No past surgical history on file. Family History  Problem Relation Age of Onset  . Heart failure Mother   . Cancer Father     Social History   Social History  . Marital Status: Married    Spouse Name: N/A  .  Number of Children: 2  . 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 Smoking status: Former Games developer  . Smokeless tobacco: Never Used  . Alcohol Use: No  . Drug Use: No  . Sexual Activity: Not on file   Other Topics Concern  . Not on file   Social History Narrative     Objective: BP 164/80 mmHg  Pulse 78  Wt 142 lb (64.411 kg)  General: Alert and Oriented, No Acute Distress HEENT: Pupils equal, round, reactive to light. Conjunctivae clear.  Moist mucous membranes Lungs: Clear to auscultation bilaterally, no wheezing/ronchi/rales.  Comfortable work of breathing. Good air movement. Cardiac: Regular rate and rhythm. Normal S1/S2.  No murmurs, rubs, nor gallops.   Extremities: No peripheral edema.  Strong peripheral pulses.  Mental Status: No depression, anxiety, nor agitation. Skin: Warm and dry. Senile purpura on the back of the hands.  Assessment & Plan: Myan was seen today for f/u labs and hypertension.  Diagnoses and all orders for this visit:  Bilateral edema of lower extremity  Anemia, iron deficiency  Other orders -     budesonide-formoterol (SYMBICORT) 160-4.5 MCG/ACT inhaler; INHALE 2 PUFFS INTO THE LUNGS 2 (TWO) TIMES DAILY.   Bilateral lower extremity edema: Resolved Anemia: Resolved, he's really passionate  about not wanting to take any iron supplements. He wants to stop taking this. I let him know that it's hard to say whether or not he absolutely needs it and the only way to check on this would be to have him stop it for some period of time and recheck both iron and hemoglobin. We both chosen a period of 2 weeks while stop taking his medication and return for lab only visit to determine if he needs to restart it. Point out that his pigmentation in the back of his hands is actually due to chronic bruising and hemosiderin deposition due to senile purpura. I provided him reassurance that he does not appear  to have Addison's disease and no further testing for this is necessary.  40 minutes spent face-to-face during visit today of which at least 50% was counseling or coordinating care regarding: 1. Bilateral edema of lower extremity   2. Anemia, iron deficiency        Return for call for lab orders in two weeks, f/u for wellness visit in one month.

## 2015-09-15 ENCOUNTER — Other Ambulatory Visit: Payer: Self-pay | Admitting: Family Medicine

## 2015-09-22 ENCOUNTER — Other Ambulatory Visit: Payer: Self-pay | Admitting: Family Medicine

## 2015-09-28 ENCOUNTER — Other Ambulatory Visit: Payer: Self-pay | Admitting: Family Medicine

## 2015-09-30 ENCOUNTER — Other Ambulatory Visit: Payer: Self-pay | Admitting: Family Medicine

## 2015-10-02 ENCOUNTER — Telehealth: Payer: Self-pay

## 2015-10-02 DIAGNOSIS — D509 Iron deficiency anemia, unspecified: Secondary | ICD-10-CM

## 2015-10-02 NOTE — Telephone Encounter (Signed)
Lab slip faxed

## 2015-10-02 NOTE — Telephone Encounter (Signed)
Kenneth Lin, Lab slip in your in box.

## 2015-10-02 NOTE — Telephone Encounter (Signed)
Patient would like his iron rechecked. He would also like his potassium and sodium checked. He states it is for gland dysfunction.

## 2015-10-03 ENCOUNTER — Other Ambulatory Visit: Payer: Self-pay

## 2015-10-03 MED ORDER — DILTIAZEM HCL ER COATED BEADS 180 MG PO CP24: 180.0000 mg | ORAL_CAPSULE | Freq: Every day | ORAL | Status: DC

## 2015-10-04 LAB — BASIC METABOLIC PANEL WITH GFR
BUN: 14 mg/dL (ref 7–25)
CALCIUM: 9.8 mg/dL (ref 8.6–10.3)
CHLORIDE: 104 mmol/L (ref 98–110)
CO2: 30 mmol/L (ref 20–31)
CREATININE: 1 mg/dL (ref 0.70–1.11)
GFR, Est African American: 80 mL/min (ref >=60)
GFR, Est Non African American: 69 mL/min (ref >=60)
GLUCOSE: 94 mg/dL (ref 65–99)
Potassium: 4.6 mmol/L (ref 3.5–5.3)
Sodium: 141 mmol/L (ref 135–146)

## 2015-10-04 LAB — IRON AND TIBC
%SAT: 16 % (ref 15–60)
IRON: 48 ug/dL — AB (ref 50–180)
TIBC: 304 ug/dL (ref 250–425)
UIBC: 256 ug/dL (ref 125–400)

## 2015-10-05 ENCOUNTER — Ambulatory Visit (INDEPENDENT_AMBULATORY_CARE_PROVIDER_SITE_OTHER): Payer: Medicare Other | Admitting: Family Medicine

## 2015-10-05 ENCOUNTER — Encounter: Payer: Self-pay | Admitting: Family Medicine

## 2015-10-05 VITALS — BP 166/79 | HR 81 | Temp 97.7°F | Wt 142.0 lb

## 2015-10-05 DIAGNOSIS — D509 Iron deficiency anemia, unspecified: Secondary | ICD-10-CM

## 2015-10-05 DIAGNOSIS — I480 Paroxysmal atrial fibrillation: Secondary | ICD-10-CM

## 2015-10-05 DIAGNOSIS — D692 Other nonthrombocytopenic purpura: Secondary | ICD-10-CM | POA: Diagnosis not present

## 2015-10-05 MED ORDER — FERROUS SULFATE 325 (65 FE) MG PO TBEC: DELAYED_RELEASE_TABLET | ORAL | Status: DC

## 2015-10-05 MED ORDER — METOPROLOL SUCCINATE ER 100 MG PO TB24: 100.0000 mg | ORAL_TABLET | Freq: Every day | ORAL | Status: DC

## 2015-10-05 NOTE — Progress Notes (Signed)
CC: Kenneth Lin is a 81 y.o. male is here for not feeling well   Subjective: HPI:  Follow-up anemia: He recently had an iron panel drawn with a mildly deficient iron level. He's taking 1 325 mg iron sulfate supplement on a daily basis. Denies shortness of breath nor constipation.  Follow-up atrial fibrillation: Denies rapid heartbeat. He cannot limp last time he had taken extra diltiazem. For the past 4 years he has noticed that he's been dizzy in the afternoons usually between the hours of 1:00 and 3:00. Symptoms are worsening takes a nap. Symptoms have been slowly worsening over the years. He has not had any falls. He was doing some research and found that this started at the same time long-acting diltiazem was started. Denies any nasal congestion, facial pain, nasal congestion, confusion or headache.  He wants to make sure that the darkening on the back of his hands are still not suggestive of Addison's disease.   Review Of Systems Outlined In HPI  Past Medical History  Diagnosis Date  . COPD (chronic obstructive pulmonary disease)   . Paroxysmal atrial fibrillation     Seeing Dr. Orson Lin at [facility].    . Essential hypertension, benign   . Other and unspecified hyperlipidemia   . Peripheral artery disease     Left ABI of leg abnormal.      No past surgical history on file. Family History  Problem Relation Age of Onset  . Heart failure Mother   . Cancer Father     Social History   Social History  . Marital Status: Married    Spouse Name: N/A  . Number of Children: 2  . 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 Smoking status: Former Games developer  . Smokeless tobacco: Never Used  . Alcohol Use: No  . Drug Use: No  . Sexual Activity: Not on file   Other Topics Concern  . Not on file   Social History Narrative     Objective: BP 166/79 mmHg  Pulse 81  Temp(Src) 97.7 F (36.5 C) (Oral)  Wt 142 lb (64.411 kg)   SpO2 99%  General: Alert and Oriented, No Acute Distress HEENT: Pupils equal, round, reactive to light. Conjunctivae clear.  Moist mucous membranes Lungs: Clear to auscultation bilaterally, no wheezing/ronchi/rales.  Comfortable work of breathing. Good air movement. Cardiac:   Irregularly irregular rhythm less than 100 bpm. Normal S1/S2.  No murmurs, rubs, nor gallops.   Extremities: No peripheral edema.  Strong peripheral pulses.  Mental Status: No depression, anxiety, nor agitation. Skin: Warm and dry. Mild bruising on the back of hands  Assessment & Plan: Kenneth Lin was seen today for not feeling well.  Diagnoses and all orders for this visit:  Senile purpura (HCC)  Anemia, iron deficiency  Paroxysmal atrial fibrillation (HCC)  Other orders -     metoprolol succinate (TOPROL-XL) 100 MG 24 hr tablet; Take 1 tablet (100 mg total) by mouth daily. Take with or immediately following a meal. Discontinue Diltiazem  ER   Iron deficiency anemia: Asymptomatic right now I encouraged him to increase ferrous sulfate to twice a day dosing. Paroxysmal atrial fibrillation: Controlled however hypertension is not controlled and with the possibility of diltiazem causing lightheadedness I given him the option of switching to metoprolol for blood pressure and rate control. He would like to go ahead and do this with the understanding that this medication also poses  a risk of causing dizziness but I don't see any harm in at least trying it. Time was taken to answer all his questions regarding risks and benefits of this medication along with the physiology that is manipulated by this medication.  40 minutes spent face-to-face during visit today of which at least 50% was counseling or coordinating care regarding: 1. Senile purpura (HCC)   2. Anemia, iron deficiency   3. Paroxysmal atrial fibrillation (HCC)      Return in about 4 weeks (around 11/02/2015), or if symptoms worsen or fail to improve.

## 2015-10-10 ENCOUNTER — Other Ambulatory Visit: Payer: Self-pay | Admitting: Family Medicine

## 2015-10-16 ENCOUNTER — Other Ambulatory Visit: Payer: Self-pay | Admitting: Family Medicine

## 2015-12-03 ENCOUNTER — Other Ambulatory Visit: Payer: Self-pay | Admitting: Family Medicine

## 2015-12-06 ENCOUNTER — Other Ambulatory Visit: Payer: Self-pay | Admitting: Family Medicine

## 2015-12-07 ENCOUNTER — Other Ambulatory Visit: Payer: Self-pay | Admitting: Family Medicine

## 2015-12-07 DIAGNOSIS — E611 Iron deficiency: Secondary | ICD-10-CM

## 2015-12-12 LAB — IRON AND TIBC
%SAT: 31 % (ref 15–60)
IRON: 88 ug/dL (ref 50–180)
TIBC: 281 ug/dL (ref 250–425)
UIBC: 193 ug/dL (ref 125–400)

## 2015-12-12 LAB — CBC WITH DIFFERENTIAL/PLATELET
Basophils Absolute: 0 10*3/uL (ref 0.0–0.1)
Basophils Relative: 0 % (ref 0–1)
EOS ABS: 0.1 10*3/uL (ref 0.0–0.7)
EOS PCT: 2 % (ref 0–5)
HCT: 42 % (ref 39.0–52.0)
Hemoglobin: 13.9 g/dL (ref 13.0–17.0)
LYMPHS ABS: 0.7 10*3/uL (ref 0.7–4.0)
Lymphocytes Relative: 12 % (ref 12–46)
MCH: 30 pg (ref 26.0–34.0)
MCHC: 33.1 g/dL (ref 30.0–36.0)
MCV: 90.7 fL (ref 78.0–100.0)
MONO ABS: 0.5 10*3/uL (ref 0.1–1.0)
MONOS PCT: 8 % (ref 3–12)
MPV: 13.4 fL — ABNORMAL HIGH (ref 8.6–12.4)
Neutro Abs: 4.8 10*3/uL (ref 1.7–7.7)
Neutrophils Relative %: 78 % — ABNORMAL HIGH (ref 43–77)
PLATELETS: 188 10*3/uL (ref 150–400)
RBC: 4.63 MIL/uL (ref 4.22–5.81)
RDW: 14.7 % (ref 11.5–15.5)
WBC: 6.1 10*3/uL (ref 4.0–10.5)

## 2015-12-18 ENCOUNTER — Other Ambulatory Visit: Payer: Self-pay | Admitting: Family Medicine

## 2015-12-20 ENCOUNTER — Other Ambulatory Visit: Payer: Self-pay | Admitting: Family Medicine

## 2015-12-22 ENCOUNTER — Other Ambulatory Visit: Payer: Self-pay | Admitting: Family Medicine

## 2015-12-23 ENCOUNTER — Other Ambulatory Visit: Payer: Self-pay | Admitting: Family Medicine

## 2015-12-27 ENCOUNTER — Telehealth: Payer: Self-pay

## 2015-12-27 NOTE — Telephone Encounter (Signed)
Patient called to see if we had any symbicort samples.  He likes to have an extra on hand in case something happens to his other.  Left message informing patient we do not have any samples at this time and he can maybe try back the 1st of the year.

## 2015-12-27 NOTE — Telephone Encounter (Signed)
Left message informing patient that we do have symbicort samples for him.    Lot [id number] exp12/2017

## 2016-01-02 ENCOUNTER — Telehealth: Payer: Self-pay

## 2016-01-02 ENCOUNTER — Other Ambulatory Visit: Payer: Self-pay

## 2016-01-02 MED ORDER — BUDESONIDE-FORMOTEROL FUMARATE 160-4.5 MCG/ACT IN AERO: INHALATION_SPRAY | RESPIRATORY_TRACT | Status: DC

## 2016-01-02 MED ORDER — TIOTROPIUM BROMIDE MONOHYDRATE 18 MCG IN CAPS: 18.0000 ug | ORAL_CAPSULE | Freq: Every day | RESPIRATORY_TRACT | Status: DC

## 2016-01-02 NOTE — Telephone Encounter (Signed)
Symbicort refill sent. 

## 2016-01-03 ENCOUNTER — Ambulatory Visit: Payer: Medicare Other | Admitting: Family Medicine

## 2016-01-04 ENCOUNTER — Encounter: Payer: Self-pay | Admitting: Osteopathic Medicine

## 2016-01-04 ENCOUNTER — Ambulatory Visit (INDEPENDENT_AMBULATORY_CARE_PROVIDER_SITE_OTHER): Payer: Medicare Other

## 2016-01-04 ENCOUNTER — Ambulatory Visit (INDEPENDENT_AMBULATORY_CARE_PROVIDER_SITE_OTHER): Payer: Medicare Other | Admitting: Osteopathic Medicine

## 2016-01-04 VITALS — BP 160/86 | HR 71 | Temp 97.9°F | Wt 138.0 lb

## 2016-01-04 DIAGNOSIS — L821 Other seborrheic keratosis: Secondary | ICD-10-CM | POA: Diagnosis not present

## 2016-01-04 DIAGNOSIS — Z87891 Personal history of nicotine dependence: Secondary | ICD-10-CM | POA: Diagnosis not present

## 2016-01-04 DIAGNOSIS — J449 Chronic obstructive pulmonary disease, unspecified: Secondary | ICD-10-CM

## 2016-01-04 DIAGNOSIS — R059 Cough, unspecified: Secondary | ICD-10-CM

## 2016-01-04 DIAGNOSIS — R05 Cough: Secondary | ICD-10-CM

## 2016-01-04 DIAGNOSIS — J441 Chronic obstructive pulmonary disease with (acute) exacerbation: Secondary | ICD-10-CM

## 2016-01-04 MED ORDER — PREDNISONE 20 MG PO TABS: ORAL_TABLET | ORAL | Status: AC

## 2016-01-04 MED ORDER — AZITHROMYCIN 250 MG PO TABS: ORAL_TABLET | ORAL | Status: AC

## 2016-01-04 NOTE — Progress Notes (Signed)
HPI: Kenneth Lin is a 82 y.o. male who [facility]  today for chief complaint of:  Chief Complaint  Patient presents with  . Cough   . Quality: coughing . Severity: moderate to severe . Duration: few days . Context: no sick contacts, no recent travel . Modifying factors: has tried the following OTC medications: none without relief . Assoc signs/symptoms: no fever/chills, mildly productive cough, No  body aches, No  GI upset   Past medical, social and family history reviewed: Past Medical History  Diagnosis Date  . COPD (chronic obstructive pulmonary disease) (HCC)   . Paroxysmal atrial fibrillation (HCC)     [facility]    . Essential hypertension, benign   . Other and unspecified hyperlipidemia   . Peripheral artery disease (HCC)     Left ABI of leg abnormal.     No past surgical history on file. Social History  Substance Use Topics  . Smoking status: Former Games developermoker  . Smokeless tobacco: Never Used  . Alcohol Use: No   Family History  Problem Relation Age of Onset  . Heart failure Mother   . Cancer Father     Current Outpatient Prescriptions  Medication Sig Dispense Refill  . albuterol (PROVENTIL HFA;VENTOLIN HFA) 108 (90 BASE) MCG/ACT inhaler Inhale 2 puffs into the lungs every 6 (six) hours as needed for wheezing. 1 Inhaler 6  . aspirin 325 MG tablet Take 1 tablet (325 mg total) by mouth daily. 30 tablet 5  . budesonide-formoterol (SYMBICORT) 160-4.5 MCG/ACT inhaler INHALE 2 PUFFS INTO THE LUNGS 2 (TWO) TIMES DAILY. 10.2 Inhaler 3  . diazepam (VALIUM) 5 MG tablet TAKE 1 TABLET BY MOUTH EVERY 12 HOURS AS NEEDED FOR ANXIETY 60 tablet 0  . diltiazem (CARDIZEM) 30 MG tablet Take 1 tablet (30 mg total) by mouth every 6 (six) hours as needed (if pulse above 100 BPM.). Patient needs to schedule a follow up appointment before more refills. 30 tablet 0  . ferrous sulfate 325 (65 FE) MG EC tablet 1 by mouth twice  a day with meals  3  . metoprolol succinate (TOPROL-XL) 100 MG 24 hr tablet TAKE 1 TABLET EVERY DAY -TAKE IMMEDIATELY FOLLOWING A MEAL-STOP DILTIAZEM ER 180MG  30 tablet 2  . nystatin (MYCOSTATIN) 100000 UNIT/ML suspension TAKE 5ML(1 TEASPOON) 4 TIMES DAILY-KEEP IN MOUTH FOR 5MINUTES-STOP 48HRS AFTER THRUSH IS GONE 120 mL 2  . tiotropium (SPIRIVA HANDIHALER) 18 MCG inhalation capsule Place 1 capsule (18 mcg total) into inhaler and inhale daily. 30 capsule 5   No current facility-administered medications for this visit.   Allergies  Allergen Reactions  . Gabapentin     Dizziness, shaking, insomnia      Review of Systems: CONSTITUTIONAL: no fever/chills HEAD/EYES/EARS/NOSE/THROAT: no headache, no vision change or hearing change, no sore throat CARDIAC: No chest pain/pressure/palpitations, no orthopnea RESPIRATORY: yes cough, no shortness of breath GASTROINTESTINAL: no nausea, no vomiting, no abdominal pain/blood in stool/diarrhea/constipation MUSCULOSKELETAL: no myalgia/arthralgia   Exam:  BP 160/86 mmHg  Pulse 71  Temp(Src) 97.9 F (36.6 C) (Oral)  Wt 138 lb (62.596 kg)  SpO2 98% pressure confirmed on manual measurement Constitutional: VSS, see above. General Appearance: alert, well-developed, well-nourished, NAD Eyes: Normal lids and conjunctive, non-icteric sclera, PERRLA Ears, Nose, Mouth, Throat: Normal external inspection ears/nares/mouth/lips/gums, MMM;  Respiratory: Normal respiratory effort. No  Wheeze/rhonchi/rales, significantly diminished breath sounds bilaterally  Cardiovascular: S1/S2 normal, no murmur/rub/gallop auscultated. Irregularly irregular rhythm    CXR on  personal review Cardiomediastinal silhouette/heart size: normal Obvious bony abnormality: none Infiltrate: questionable LLL though this is stable on review of previous CXR Mass or other opacity: none noted Atelectasis: none Diaphragms: abnormal - flattened, c/w previous image Lateral view: no  concerns Images were reviewed with the patient. Pt counseled that radiologist will review the images as well, our office will call if the formal read reveals any significant findings other than what has been noted above.    ASSESSMENT/PLAN: COPD exacerbation, treated as below, patient was given printed prescription for azithromycin to fill if he starts to get worse, otherwise increased frequency of albuterol use, continue other inhalers as currently taking, steroid shot and prednisone taper   Cough - Plan: DG Chest 2 View  COPD exacerbation (HCC) - Plan: azithromycin (ZITHROMAX) 250 MG tablet, predniSONE (DELTASONE) 20 MG tablet  Seborrheic keratoses - follow-up with [name], can consider removal/freezing  Hypertension - blood pressure stable from previous exams, patient has no cardiac complaints, no chest pain, medications recently adjusted, will defer to [name]    Return in about 1 week (around 01/11/2016) for BLOOD PRESSURE FOLLOWUP AND RECHECK SKIN PROBLEM WITH DR HOMMEL.

## 2016-01-04 NOTE — Patient Instructions (Signed)
We will call you if the radiologist says anything different about your X-ray.  Hang on to the printed prescription for antibiotics and start these if you are getting worse.  Do take the steroid pills.  You can use albuterol more frequently during the day, continue your other inhalers as usual.  If you get worse, please come back to the clinic or go to the emergency room!

## 2016-01-05 DIAGNOSIS — R05 Cough: Secondary | ICD-10-CM | POA: Diagnosis not present

## 2016-01-05 DIAGNOSIS — J441 Chronic obstructive pulmonary disease with (acute) exacerbation: Secondary | ICD-10-CM | POA: Diagnosis not present

## 2016-01-05 MED ORDER — METHYLPREDNISOLONE SODIUM SUCC 125 MG IJ SOLR
125.0000 mg | Freq: Once | INTRAMUSCULAR | Status: AC
  Administered 2016-01-05: 125 mg via INTRAMUSCULAR

## 2016-01-05 NOTE — Addendum Note (Signed)
Addended by: Collie SiadICHARDSON, Raheen Capili M on: 01/05/2016 11:09 AM   Modules accepted: Orders

## 2016-01-09 ENCOUNTER — Ambulatory Visit: Payer: Medicare Other | Admitting: Family Medicine

## 2016-01-17 ENCOUNTER — Ambulatory Visit: Payer: Medicare Other | Admitting: Family Medicine

## 2016-01-19 ENCOUNTER — Ambulatory Visit (INDEPENDENT_AMBULATORY_CARE_PROVIDER_SITE_OTHER): Payer: Medicare Other | Admitting: Family Medicine

## 2016-01-19 ENCOUNTER — Encounter: Payer: Self-pay | Admitting: Family Medicine

## 2016-01-19 VITALS — BP 155/83 | HR 85 | Wt 141.0 lb

## 2016-01-19 DIAGNOSIS — J449 Chronic obstructive pulmonary disease, unspecified: Secondary | ICD-10-CM | POA: Diagnosis not present

## 2016-01-19 MED ORDER — HYDROCODONE-HOMATROPINE 5-1.5 MG/5ML PO SYRP: 5.0000 mL | ORAL_SOLUTION | Freq: Every evening | ORAL | Status: DC | PRN

## 2016-01-19 NOTE — Progress Notes (Signed)
CC: Kenneth Lin is a 82 y.o. male is here for COPD   Subjective: HPI:  Follow-up COPD exacerbation: He was having increased sputum production, shortness of breath and cough that began 3 weeks ago, 2 weeks ago he was seen by my partner started on a prednisone taper. He's been having to use his oxygen at night to help sleep. He tells me he feels tremendously better other than a stubborn cough at night that keeps him awake at night despite taking Robitussin. Symptoms are moderate in severity and slowly improving. He denies any shortness of breath at night or with activity. He is taking Symbicort and Spiriva. He denies any wheezing, chest discomfort, chest pain nor confusion. He was having fevers and chills but this resolved week ago he never had a take azithromycin   Review Of Systems Outlined In HPI  Past Medical History  Diagnosis Date  . COPD (chronic obstructive pulmonary disease) (HCC)   . Paroxysmal atrial fibrillation (HCC)     Seeing [name] at [facility].    . Essential hypertension, benign   . Other and unspecified hyperlipidemia   . Peripheral artery disease (HCC)     Left ABI of leg abnormal.      No past surgical history on file. Family History  Problem Relation Age of Onset  . Heart failure Mother   . Cancer Father     Social History   Social History  . Marital Status: Married    Spouse Name: N/A  . Number of Children: 2  . 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 Smoking status: Former Games developer  . Smokeless tobacco: Never Used  . Alcohol Use: No  . Drug Use: No  . Sexual Activity: Not on file   Other Topics Concern  . Not on file   Social History Narrative     Objective: BP 155/83 mmHg  Pulse 85  Wt 141 lb (63.957 kg)  SpO2 94%  General: Alert and Oriented, No Acute Distress HEENT: Pupils equal, round, reactive to light. Conjunctivae clear.  External ears unremarkable, canals clear with  intact TMs with appropriate landmarks.  Middle ear appears open without effusion. Pink inferior turbinates.  Moist mucous membranes, pharynx without inflammation nor lesions.  Neck supple without palpable lymphadenopathy nor abnormal masses. Lungs: Clear to auscultation bilaterally, no wheezing/ronchi/rales.  Comfortable work of breathing. Good air movement. Cardiac: Regular rate and rhythm. Normal S1/S2.  No murmurs, rubs, nor gallops.   Extremities: No peripheral edema.  Strong peripheral pulses.  Mental Status: No depression, anxiety, nor agitation. Skin: Warm and dry.  Assessment & Plan: Kenneth Lin was seen today for copd.  Diagnoses and all orders for this visit:  COPD, moderate (HCC) -     HYDROcodone-homatropine (HYCODAN) 5-1.5 MG/5ML syrup; Take 5 mLs by mouth at bedtime as needed for cough.   COPD exacerbation: Controlled and almost fully resolved. To help with his persistent cough offered Hycodan to take at bedtime only. Discussed sedation possibility.  25 minutes spent face-to-face during visit today of which at least 50% was counseling or coordinating care regarding: 1. COPD, moderate (HCC)      Return if symptoms worsen or fail to improve.

## 2016-02-13 ENCOUNTER — Other Ambulatory Visit: Payer: Self-pay | Admitting: Family Medicine

## 2016-02-14 MED ORDER — DIAZEPAM 5 MG PO TABS: 5.0000 mg | ORAL_TABLET | Freq: Two times a day (BID) | ORAL | Status: DC | PRN

## 2016-02-14 NOTE — Telephone Encounter (Signed)
Kenneth Lin, Rx placed in in-box ready for pickup/faxing.  

## 2016-02-16 ENCOUNTER — Other Ambulatory Visit: Payer: Self-pay | Admitting: Family Medicine

## 2016-02-16 ENCOUNTER — Telehealth: Payer: Self-pay | Admitting: Family Medicine

## 2016-02-16 DIAGNOSIS — R531 Weakness: Secondary | ICD-10-CM

## 2016-02-16 NOTE — Telephone Encounter (Signed)
Potassium lab slip in your in box.

## 2016-02-16 NOTE — Telephone Encounter (Signed)
Please advise 

## 2016-02-16 NOTE — Telephone Encounter (Signed)
Pt called. He's been experiencing, dizziness, fatigue and weakness for over a year which often occurs  after lunch. He says his pcp does not know what is casuing this but he believes after doing a search on his smart phone that it may be that his Potassium is high.  Wants a call back regarding this.  Please call him on his cell ph #[phone]. Thank you.

## 2016-02-16 NOTE — Telephone Encounter (Signed)
Pt.notified

## 2016-02-19 ENCOUNTER — Telehealth: Payer: Self-pay

## 2016-02-19 ENCOUNTER — Other Ambulatory Visit: Payer: Self-pay | Admitting: Family Medicine

## 2016-02-19 DIAGNOSIS — D509 Iron deficiency anemia, unspecified: Secondary | ICD-10-CM

## 2016-02-19 NOTE — Telephone Encounter (Signed)
It does not look like this is due until June at the earliest unless he has any new bleeding issues.

## 2016-02-19 NOTE — Telephone Encounter (Signed)
Kenneth Lin has a potassium check today. He wanted to know if it was time to have his iron checked. Please advise.

## 2016-02-20 LAB — IRON AND TIBC
%SAT: 25 % (ref 15–60)
Iron: 67 ug/dL (ref 50–180)
TIBC: 270 ug/dL (ref 250–425)
UIBC: 203 ug/dL (ref 125–400)

## 2016-02-20 LAB — POTASSIUM: Potassium: 4.3 mmol/L (ref 3.5–5.3)

## 2016-03-22 ENCOUNTER — Other Ambulatory Visit: Payer: Self-pay | Admitting: Family Medicine

## 2016-03-25 ENCOUNTER — Other Ambulatory Visit: Payer: Self-pay

## 2016-03-25 ENCOUNTER — Telehealth: Payer: Self-pay | Admitting: Family Medicine

## 2016-03-25 MED ORDER — ZOLPIDEM TARTRATE 5 MG PO TABS: 5.0000 mg | ORAL_TABLET | Freq: Every evening | ORAL | Status: DC | PRN

## 2016-03-25 NOTE — Telephone Encounter (Signed)
Pt would like refill sent to [facility].

## 2016-03-25 NOTE — Telephone Encounter (Signed)
Kenneth Lin,  In regards to the [facility] request for zolpidem 5mg  daily I'm ok with refilling this is the patient truly wants it. Can you check with Mr. [name] to see if he really wants to restart this?

## 2016-04-26 ENCOUNTER — Other Ambulatory Visit: Payer: Self-pay | Admitting: Family Medicine

## 2016-05-23 ENCOUNTER — Other Ambulatory Visit: Payer: Self-pay | Admitting: Family Medicine

## 2016-06-11 ENCOUNTER — Other Ambulatory Visit: Payer: Self-pay | Admitting: Family Medicine

## 2016-06-18 ENCOUNTER — Other Ambulatory Visit: Payer: Self-pay | Admitting: Family Medicine

## 2016-06-24 ENCOUNTER — Other Ambulatory Visit: Payer: Self-pay | Admitting: Family Medicine

## 2016-07-01 ENCOUNTER — Other Ambulatory Visit: Payer: Self-pay | Admitting: Family Medicine

## 2016-07-05 ENCOUNTER — Other Ambulatory Visit: Payer: Self-pay | Admitting: Family Medicine

## 2016-07-12 ENCOUNTER — Telehealth: Payer: Self-pay | Admitting: Family Medicine

## 2016-07-12 NOTE — Telephone Encounter (Signed)
Pt called clinic requesting Rx for Nystatin oral liquid. Pt reports he thinks he is getting thrush again. Advised Pt he would need an appointment, but if he scheduled one today I would go ahead and send over Rx. Pt denied appointment, no Rx sent.

## 2016-07-23 ENCOUNTER — Other Ambulatory Visit: Payer: Self-pay | Admitting: Family Medicine

## 2016-07-29 ENCOUNTER — Other Ambulatory Visit: Payer: Self-pay | Admitting: Family Medicine

## 2016-08-07 ENCOUNTER — Other Ambulatory Visit: Payer: Self-pay | Admitting: Family Medicine

## 2016-08-08 ENCOUNTER — Telehealth: Payer: Self-pay | Admitting: Family Medicine

## 2016-08-08 ENCOUNTER — Other Ambulatory Visit: Payer: Self-pay | Admitting: Family Medicine

## 2016-08-08 NOTE — Telephone Encounter (Signed)
[facility]

## 2016-08-08 NOTE — Telephone Encounter (Signed)
Pt called. He wants a refill on his inhaler & he has scheduled an appointment on 8/16.  Thank you.

## 2016-08-14 ENCOUNTER — Ambulatory Visit (INDEPENDENT_AMBULATORY_CARE_PROVIDER_SITE_OTHER): Payer: Medicare Other | Admitting: Family Medicine

## 2016-08-14 ENCOUNTER — Encounter: Payer: Self-pay | Admitting: Family Medicine

## 2016-08-14 VITALS — BP 173/97 | HR 50 | Wt 141.0 lb

## 2016-08-14 DIAGNOSIS — J449 Chronic obstructive pulmonary disease, unspecified: Secondary | ICD-10-CM | POA: Diagnosis not present

## 2016-08-14 DIAGNOSIS — I480 Paroxysmal atrial fibrillation: Secondary | ICD-10-CM

## 2016-08-14 DIAGNOSIS — D509 Iron deficiency anemia, unspecified: Secondary | ICD-10-CM | POA: Diagnosis not present

## 2016-08-14 MED ORDER — NYSTATIN 100000 UNIT/ML MT SUSP: OROMUCOSAL | 2 refills | Status: DC

## 2016-08-14 MED ORDER — DILTIAZEM HCL ER COATED BEADS 180 MG PO CP24: 180.0000 mg | ORAL_CAPSULE | Freq: Every day | ORAL | 1 refills | Status: DC

## 2016-08-14 MED ORDER — BUDESONIDE-FORMOTEROL FUMARATE 160-4.5 MCG/ACT IN AERO: 2.0000 | INHALATION_SPRAY | Freq: Two times a day (BID) | RESPIRATORY_TRACT | 3 refills | Status: DC

## 2016-08-14 MED ORDER — TIOTROPIUM BROMIDE MONOHYDRATE 18 MCG IN CAPS
ORAL_CAPSULE | RESPIRATORY_TRACT | 3 refills | Status: DC
Start: 2016-08-14 — End: 2016-09-03

## 2016-08-14 MED ORDER — DIAZEPAM 5 MG PO TABS: ORAL_TABLET | ORAL | 3 refills | Status: DC

## 2016-08-14 NOTE — Progress Notes (Signed)
CC: Kenneth ModenaRonald Lin is a 82 y.o. male is here for Medication Refill   Subjective: HPI:  Follow-up COPD: He ran out of his Spiriva and Symbicort his cough has worsened. It's worse at night when trying to fall asleep. He's not been using his rescue inhaler despite coughing frequently. He denies any shortness of breath or wheezing. Denies any chest discomfort  Paroxysmal atrial fibrillation: He's been taking metoprolol ever since I saw him last, in place of diltiazem.  Ever since he started this medication he's noticed symptoms of Raynaud's phenomenon, is the whole bunch of research on this and wants to talk all about it.  Follow-up anemia: He denies any recent bruising or bleeding. He still takes an iron supplement. He continues to experience dizziness that he's been expansion for over 2 years now and wants to know if maybe it's due to low iron. Review Of Systems Outlined In HPI  Past Medical History:  Diagnosis Date  . COPD (chronic obstructive pulmonary disease) (HCC)   . Essential hypertension, benign   . Other and unspecified hyperlipidemia   . Paroxysmal atrial fibrillation (HCC)    Seeing Dr. Orson Lin at [facility] Kenneth Lin.    . Peripheral artery disease (HCC)    Left ABI of leg abnormal.      No past surgical history on file. Family History  Problem Relation Age of Onset  . Heart failure Mother   . Cancer Father     Social History   Social History  . Marital status: Married    Spouse name: N/A  . Number of children: 2  . Years of education: N/A   Occupational History  . RETIRED SHERIFF     Social History Main Topics  . Smoking status: Former Games developermoker  . Smokeless tobacco: Never Used  . Alcohol use No  . Drug use: No  . Sexual activity: Not on file   Other Topics Concern  . Not on file   Social History Narrative  . No narrative on file     Objective: BP (!) 173/97   Pulse (!) 50   Wt 141 lb (64 kg)   BMI 20.23 kg/m   Vital signs reviewed. General: Alert and Oriented,  No Acute Distress HEENT: Pupils equal, round, reactive to light. Conjunctivae clear.  External ears unremarkable.  Moist mucous membranes. Lungs: Clear and comfortable work of breathing, speaking in full sentences without accessory muscle use. Cardiac: Regular rate and rhythm.  Neuro: CN II-XII grossly intact, gait normal. Extremities: No peripheral edema.  Strong peripheral pulses.  Mental Status: No depression, anxiety, nor agitation. Logical though process. Skin: Warm and dry.  Assessment & Plan: Kenneth Lin was seen today for medication refill.  Diagnoses and all orders for this visit:  Paroxysmal atrial fibrillation (HCC)  COPD, moderate (HCC)  Anemia, iron deficiency -     Iron and TIBC  Other orders -     diltiazem (CARDIZEM CD) 180 MG 24 hr capsule; Take 1 capsule (180 mg total) by mouth daily. Stop metoprolol. -     nystatin (MYCOSTATIN) 100000 UNIT/ML suspension; TAKE 5ML(1 TEASPOON) 4 TIMES DAILY-KEEP IN MOUTH FOR 5MINUTES-STOP 48HRS AFTER THRUSH IS GONE -     diazepam (VALIUM) 5 MG tablet; TAKE 1 TABLET EVERY 12 HOURS AS NEEDED ANXIETY -     tiotropium (SPIRIVA HANDIHALER) 18 MCG inhalation capsule; PLACE ONE CAPSULE INTO THE INHALER AND INHALE DAILY -     budesonide-formoterol (SYMBICORT) 160-4.5 MCG/ACT inhaler; Inhale 2 puffs into the lungs 2 (two) times  daily.   Paroxysmal atrial fibrillation: Currently rate controlled with metoprolol however he is experiencing side effects, particularly making him more vulnerable to Raynaud's phenomenon. He had a lot of questions about this phenomenon and I made sure to answer all of them adequately. Switching back to diltiazem COPD: Uncontrolled off of Spiriva and Symbicort, refills provided Iron deficiency anemia: Seems reasonable to recheck his iron tablets been 6 months since I checked last. He would like a refill on nystatin just in case he gets thrush from Symbicort again.  Discussed with this patient that I will be resigning from  my position here with [facility] in September in order to stay with my [place]. I let him know about the providers that are still accepting patients and I feel that this individual will be under great care if he/she stays here with [facility].  Return in about 3 months (around 11/14/2016) for Breathing with Dr. Lyn HollingsheadAlexander.

## 2016-08-19 ENCOUNTER — Telehealth: Payer: Self-pay

## 2016-08-19 LAB — IRON AND TIBC
%SAT: 16 % (ref 15–60)
Iron: 49 ug/dL — ABNORMAL LOW (ref 50–180)
TIBC: 298 ug/dL (ref 250–425)
UIBC: 249 ug/dL (ref 125–400)

## 2016-08-19 MED ORDER — DILTIAZEM HCL ER COATED BEADS 120 MG PO CP24: 120.0000 mg | ORAL_CAPSULE | Freq: Every day | ORAL | 2 refills | Status: DC

## 2016-08-19 NOTE — Telephone Encounter (Signed)
Pt.notified

## 2016-08-19 NOTE — Telephone Encounter (Signed)
Ron came by and states he started diltiazem on Friday. Saturday night he had swelling in both feet and ankles. The same happened on Sunday night. He is worried the diltiazem is causing the swelling. Please advise.

## 2016-08-19 NOTE — Telephone Encounter (Signed)
This is a common side effect of this medication, if it's bothering him I'll print off a lower dose of diltiazem that he can try.

## 2016-08-25 ENCOUNTER — Other Ambulatory Visit: Payer: Self-pay | Admitting: Family Medicine

## 2016-08-31 ENCOUNTER — Other Ambulatory Visit: Payer: Self-pay | Admitting: Family Medicine

## 2016-09-03 ENCOUNTER — Other Ambulatory Visit: Payer: Self-pay | Admitting: Family Medicine

## 2016-09-03 ENCOUNTER — Telehealth: Payer: Self-pay

## 2016-09-03 MED ORDER — TIOTROPIUM BROMIDE MONOHYDRATE 2.5 MCG/ACT IN AERS: INHALATION_SPRAY | RESPIRATORY_TRACT | 11 refills | Status: AC

## 2016-09-03 NOTE — Telephone Encounter (Signed)
Patient is aware 

## 2016-09-03 NOTE — Telephone Encounter (Signed)
Kenneth Lin would like to switch the Spiriva handihaler to the Spiriva Respimat 2.5 mcg. Please advise.

## 2016-09-03 NOTE — Telephone Encounter (Signed)
New Rx sent to his [facility].

## 2016-09-07 ENCOUNTER — Other Ambulatory Visit: Payer: Self-pay | Admitting: Family Medicine

## 2016-09-12 ENCOUNTER — Ambulatory Visit (INDEPENDENT_AMBULATORY_CARE_PROVIDER_SITE_OTHER): Payer: Medicare Other

## 2016-09-12 ENCOUNTER — Encounter: Payer: Self-pay | Admitting: Osteopathic Medicine

## 2016-09-12 ENCOUNTER — Ambulatory Visit (INDEPENDENT_AMBULATORY_CARE_PROVIDER_SITE_OTHER): Payer: Medicare Other | Admitting: Osteopathic Medicine

## 2016-09-12 VITALS — BP 156/92 | HR 86 | Temp 98.1°F | Ht 70.0 in | Wt 141.0 lb

## 2016-09-12 DIAGNOSIS — R05 Cough: Secondary | ICD-10-CM | POA: Diagnosis not present

## 2016-09-12 DIAGNOSIS — R059 Cough, unspecified: Secondary | ICD-10-CM

## 2016-09-12 DIAGNOSIS — J449 Chronic obstructive pulmonary disease, unspecified: Secondary | ICD-10-CM

## 2016-09-12 DIAGNOSIS — Z87891 Personal history of nicotine dependence: Secondary | ICD-10-CM | POA: Diagnosis not present

## 2016-09-12 DIAGNOSIS — I700 Atherosclerosis of aorta: Secondary | ICD-10-CM

## 2016-09-12 DIAGNOSIS — J441 Chronic obstructive pulmonary disease with (acute) exacerbation: Secondary | ICD-10-CM | POA: Diagnosis not present

## 2016-09-12 MED ORDER — ALBUTEROL SULFATE HFA 108 (90 BASE) MCG/ACT IN AERS: INHALATION_SPRAY | RESPIRATORY_TRACT | 12 refills | Status: DC

## 2016-09-12 MED ORDER — METHYLPREDNISOLONE SODIUM SUCC 125 MG IJ SOLR
125.0000 mg | Freq: Once | INTRAMUSCULAR | Status: AC
  Administered 2016-09-12: 125 mg via INTRAMUSCULAR

## 2016-09-12 MED ORDER — DILTIAZEM HCL ER COATED BEADS 120 MG PO CP24: 120.0000 mg | ORAL_CAPSULE | Freq: Every day | ORAL | 2 refills | Status: DC

## 2016-09-12 MED ORDER — AZITHROMYCIN 250 MG PO TABS: ORAL_TABLET | ORAL | 0 refills | Status: DC

## 2016-09-12 MED ORDER — PREDNISONE 20 MG PO TABS: 20.0000 mg | ORAL_TABLET | Freq: Two times a day (BID) | ORAL | 0 refills | Status: DC

## 2016-09-12 NOTE — Progress Notes (Signed)
HPI: Kenneth Lin is a 82 y.o. male  who presents to [name] today, 09/12/16,  for chief complaint of:  Chief Complaint  Patient presents with  . Establish Care    COUGH    COPD/Cough - coughing is worse, patient requests x-ray, concerned about pneumonia . Quality:Shortness of breath, cough.  . Severity:Breathing and cough is a bit worse than baseline COPD . Duration: Few days . Modifying factors: Medications as below . Assoc signs/symptoms: Clear sputum production, increased shortness of breath. No fever, no chills, no chest pain.  Recently seen by [name] about 1 month ago 08/14/2016. "Follow-up COPD: He ran out of his Spiriva and Symbicort his cough has worsened. It's worse at night when trying to fall asleep. He's not been using his rescue inhaler despite coughing frequently. He denies any shortness of breath or wheezing. Denies any chest discomfort.Marland KitchenMarland KitchenCOPD: Uncontrolled off of Spiriva and Symbicort, refills provided"  Patient also requests being put back on diltiazem rather than alternative generic for Cardizem, thinks this may help with his overall fatigue/weakness and chronic dizziness issues   Past medical, surgical, social and family history reviewed: Past Medical History:  Diagnosis Date  . COPD (chronic obstructive pulmonary disease) (HCC)   . Essential hypertension, benign   . Other and unspecified hyperlipidemia   . Paroxysmal atrial fibrillation (HCC)    Seeing [name] at [facility].    . Peripheral artery disease (HCC)    Left ABI of leg abnormal.     No past surgical history on file. Social History  Substance Use Topics  . Smoking status: Former Games developer  . Smokeless tobacco: Never Used  . Alcohol use No   Family History  Problem Relation Age of Onset  . Heart failure Mother   . Cancer Father      Current medication list and allergy/intolerance information reviewed:   Current Outpatient Prescriptions  Medication  Sig Dispense Refill  . aspirin 325 MG tablet Take 1 tablet (325 mg total) by mouth daily. 30 tablet 5  . budesonide-formoterol (SYMBICORT) 160-4.5 MCG/ACT inhaler Inhale 2 puffs into the lungs 2 (two) times daily. 1 Inhaler 3  . diazepam (VALIUM) 5 MG tablet TAKE 1 TABLET EVERY 12 HOURS AS NEEDED ANXIETY 60 tablet 3  . diltiazem (CARDIZEM CD) 120 MG 24 hr capsule Take 1 capsule (120 mg total) by mouth daily. Replaces 180mg  formulation. 30 capsule 2  . ferrous sulfate 325 (65 FE) MG EC tablet 1 by mouth twice a day with meals  3  . nystatin (MYCOSTATIN) 100000 UNIT/ML suspension TAKE 5ML(1 TEASPOON) 4 TIMES DAILY-KEEP IN MOUTH FOR 5MINUTES-STOP 48HRS AFTER THRUSH IS GONE 120 mL 2  . SYMBICORT 160-4.5 MCG/ACT inhaler INHALE TWO PUFFS INTO LUNGS TWICE DAILY 1 Inhaler 2  . Tiotropium Bromide Monohydrate (SPIRIVA RESPIMAT) 2.5 MCG/ACT AERS Two inhalations/actuations daily. 1 Inhaler 11  . VENTOLIN HFA 108 (90 Base) MCG/ACT inhaler INHALE TWO PUFFS BY MOUTH EVERY 6 HOURS AS NEEDED FOR WHEEZING 18 each 0   No current facility-administered medications for this visit.    Allergies  Allergen Reactions  . Gabapentin     Dizziness, shaking, insomnia      Review of Systems:  Constitutional:  No  fever, no chills, No recent illness,   HEENT: No  headache  Cardiac: No  chest pain, No  pressure  Respiratory:  +shortness of breath. +Cough  Gastrointestinal: No  abdominal pain, No  nausea   Musculoskeletal: No new myalgia/arthralgia  Neurologic:  No  weakness, No  dizziness,    Exam:  BP (!) 156/92   Pulse 86   Temp 98.1 F (36.7 C) (Oral)   Ht 5\' 10"  (1.778 m)   Wt 141 lb (64 kg)   BMI 20.23 kg/m   Constitutional: VS see above. General Appearance: alert, well-developed, well-nourished, NAD  Ears, Nose, Mouth, Throat: MMM  Neck: No masses, trachea midline. No thyroid enlargement  Respiratory: Normal respiratory effort. no wheeze, no rhonchi, no rales, breath sounds markedly  diminished bilaterally  Cardiovascular: S1/S2 normal, no murmur, no rub/gallop auscultated. RRR.   Musculoskeletal: Gait normal.   Skin: warm, dry, intact.   Psychiatric: Normal judgment/insight. Normal mood and affect. Oriented x3.   Chest x-ray personally reviewed, hyperinflated lungs, flattened diaphragms. Cardiac silhouette appears normal size. I do not see any concerning infiltrate to diagnose pneumonia, questionable chronic bronchitic changes. Await radiology over read  ASSESSMENT/PLAN:   COPD exacerbation (HCC) - Plan: predniSONE (DELTASONE) 20 MG tablet, azithromycin (ZITHROMAX) 250 MG tablet  COPD, moderate (HCC) - Plan: albuterol (VENTOLIN HFA) 108 (90 Base) MCG/ACT inhaler  Cough - Plan: DG Chest 2 View   Patient Instructions  Your chest x-ray does not show any obvious pneumonia. We can start steroids for COPD exacerbation, if you are not feeling better after 1-2 days with the steroid treatment, fill the prescription for the antibiotics, but I don't think that you should need these. If breathing gets worse, please let us know, we may need to repeat a chest x-ray or listen to the lungs again.    Visit summary with medication list and pertinent instructions was printed for patient to review. All questions at time of visit were answered - patient instructed to contact office with any additional concerns. ER/RTC precautions were reviewed with the patient. Follow-up plan: Return in about 4 weeks (around 10/10/2016) for followup COPD, and dizziness.

## 2016-09-12 NOTE — Patient Instructions (Signed)
Your chest x-ray does not show any obvious pneumonia. We can start steroids for COPD exacerbation, if you are not feeling better after 1-2 days with the steroid treatment, fill the prescription for the antibiotics, but I don't think that you should need these. If breathing gets worse, please let us know, we may need to repeat a chest x-ray or listen to the lungs again.

## 2016-09-12 NOTE — Addendum Note (Signed)
Addended by: Pixie CasinoUNNINGHAM, RHONDA C on: 09/12/2016 03:04 PM   Modules accepted: Orders

## 2016-09-17 ENCOUNTER — Telehealth: Payer: Self-pay

## 2016-09-17 NOTE — Telephone Encounter (Signed)
Pt was placed on prednisone last week.  On Friday he noticed that his vision was blurry.  He reports that his vision is still blurry.  He tried to get in with his eye doc but they didn't have anything available. Please advise.

## 2016-09-17 NOTE — Telephone Encounter (Signed)
I'm not sure whether the prednisone has anything to do with the symptoms. Couldn't say more without seeing this patient in the office. Patient is concerned, he is encouraged to follow-up in urgent care

## 2016-09-18 ENCOUNTER — Telehealth: Payer: Self-pay | Admitting: *Deleted

## 2016-09-18 ENCOUNTER — Ambulatory Visit: Payer: Medicare Other | Admitting: Osteopathic Medicine

## 2016-09-18 NOTE — Telephone Encounter (Signed)
Message left on vm 

## 2016-09-18 NOTE — Telephone Encounter (Signed)
Patient had an appointment today and rescheduled it for Friday to be evaluated for blurred vision. Patient states he spokes with someone who works in an ophthalmology office who recommended he have his blood sugar checked because of the blurred vision. Patient is requesting an order be sent to the lab to have blood sugar checked. I advised him that we do not typically send orders to the lab in this situation without an evaluation. Also advised that blurred vision is a side effect of taking prednisone and happens to some people. The blurred vision started the day after starting prednisone. Since his son who is a diabetic lives with him I reccommended that he could check his blood sugar using his glucometer if that was a concern however since he has been on prednisone in the past and has not had this issue it would be best to keep appointment for evaluation. Patient requested this phone note be sent to the provider. Please advise

## 2016-09-18 NOTE — Telephone Encounter (Signed)
Pt.notified

## 2016-09-18 NOTE — Telephone Encounter (Signed)
Yes, would recommend that he be seen in the office. I'm not going to place orders without an exam and a vision screening in the office.

## 2016-09-20 ENCOUNTER — Encounter: Payer: Self-pay | Admitting: Osteopathic Medicine

## 2016-09-20 ENCOUNTER — Ambulatory Visit (INDEPENDENT_AMBULATORY_CARE_PROVIDER_SITE_OTHER): Payer: Medicare Other | Admitting: Osteopathic Medicine

## 2016-09-20 VITALS — BP 149/79 | HR 77 | Wt 138.0 lb

## 2016-09-20 DIAGNOSIS — H538 Other visual disturbances: Secondary | ICD-10-CM

## 2016-09-20 LAB — GLUCOSE, POCT (MANUAL RESULT ENTRY): POC GLUCOSE: 110 mg/dL — AB (ref 70–99)

## 2016-09-20 NOTE — Patient Instructions (Signed)
Recommend you follow up with your eye doctor for formal vision testing and a better look at the eyes. Nothing I can see looks bad or dangerous, and if this was a side effect of the steroids, it should get better in time, but since your right eye is worse, we need to get everything checked out. Your blood sugar was 110 which is ok.

## 2016-09-20 NOTE — Progress Notes (Signed)
HPI: Kenneth ModenaRonald Lin is a 82 y.o. male  who presents to [facility] today, 09/20/16,  for chief complaint of:  Chief Complaint  Patient presents with  . Blurred Vision  . Hypertension     . Context: seen last week for COPD exacerbation. Blurred vision started after he started on prednisone. He has upcoming appointment with eye doctor later today. He had apparently called his doctor told in the coming days PCP to get blood sugar checked. Patient has been off of the steroids for a few days. . Quality: distance vision is a problem . Severity: may be getting a bit better recently but still not back to normal . Timing: after prednisone, has been having Blurred vision and difficulty seeing at distance constant . Assoc signs/symptoms: no headache, no chest pain     Right eye Left eye Both eyes  Without correction 20/100 20/40 20/40    Past medical, surgical, social and family history reviewed: Past Medical History:  Diagnosis Date  . COPD (chronic obstructive pulmonary disease) (HCC)   . Essential hypertension, benign   . Other and unspecified hyperlipidemia   . Paroxysmal atrial fibrillation (HCC)    [facility]end[facility].    . Peripheral artery disease (HCC)    Left ABI of leg abnormal.     No past surgical history on file. Social History  Substance Use Topics  . Smoking status: Former Games developermoker  . Smokeless tobacco: Never Used  . Alcohol use No   Family History  Problem Relation Age of Onset  . Heart failure Mother   . Cancer Father      Current medication list and allergy/intolerance information reviewed:   Current Outpatient Prescriptions  Medication Sig Dispense Refill  . albuterol (VENTOLIN HFA) 108 (90 Base) MCG/ACT inhaler INHALE TWO PUFFS BY MOUTH EVERY 4 HOURS AS NEEDED FOR WHEEZING 18 g 12  . aspirin 325 MG tablet Take 1 tablet (325 mg total) by mouth daily. 30 tablet 5  . budesonide-formoterol (SYMBICORT) 160-4.5 MCG/ACT  inhaler Inhale 2 puffs into the lungs 2 (two) times daily. 1 Inhaler 3  . diazepam (VALIUM) 5 MG tablet TAKE 1 TABLET EVERY 12 HOURS AS NEEDED ANXIETY 60 tablet 3  . diltiazem (CARDIZEM CD) 120 MG 24 hr capsule Take 1 capsule (120 mg total) by mouth daily. Replaces 180mg  formulation. 30 capsule 2  . ferrous sulfate 325 (65 FE) MG EC tablet 1 by mouth twice a day with meals  3  . SYMBICORT 160-4.5 MCG/ACT inhaler INHALE TWO PUFFS INTO LUNGS TWICE DAILY 1 Inhaler 2  . Tiotropium Bromide Monohydrate (SPIRIVA RESPIMAT) 2.5 MCG/ACT AERS Two inhalations/actuations daily. 1 Inhaler 11   No current facility-administered medications for this visit.    Allergies  Allergen Reactions  . Gabapentin     Dizziness, shaking, insomnia      Review of Systems:  Constitutional:  No  fever, no chills, +recent illness - COPD has improved  HEENT: No  headache, +vision change, no hearing change, No sore throat, No  sinus pressure  Cardiac: No  chest pain, No  pressure, No palpitations  Respiratory:  No  shortness of breath. No  Cough  Gastrointestinal: No  abdominal pain, No  Nausea,  Neurologic: No  weakness, +chronic dizziness  Exam:  BP (!) 149/79   Pulse 77   Wt 138 lb (62.6 kg)   BMI 19.80 kg/m   Constitutional: VS see above. General Appearance: alert, well-developed, well-nourished, NAD  Eyes:  Normal lids and conjunctive, non-icteric sclera, PERRLA, EOMI, peripheral vision intact. Limited funduscopic exam given nondilated. No abnormality noted  Ears, Nose, Mouth, Throat: MMM, Normal external inspection ears/nares/mouth/lips/gums.   Neck: No masses, trachea midline.   Respiratory: Normal respiratory effort. no wheeze, no rhonchi, no rales  Cardiovascular: S1/S2 normal, no murmur, no rub/gallop auscultated. RRR.   ASSESSMENT/PLAN: Patient is very concerned about lack of definitive diagnosis. I advised him that he needs follow-up with his eye doctor for further evaluation. Hard of  hearing also somewhat limited efficient communication. 25 minutes spent as noted below  Blurry vision   Patient Instructions  Recommend you follow up with your eye doctor for formal vision testing and a better look at the eyes. Nothing I can see looks bad or dangerous, and if this was a side effect of the steroids, it should get better in time, but since your right eye is worse, we need to get everything checked out. Your blood sugar was 110 which is ok.    Visit summary with medication list and pertinent instructions was printed for patient to review. All questions at time of visit were answered - patient instructed to contact office with any additional concerns. ER/RTC precautions were reviewed with the patient. Follow-up plan: Return in about 3 months (around 12/20/2016), or sooner if needed, for follow-up on blood pressure.   Total time spent 25 minutes, greater than 50% of the visit was face to face counseling and coordinating care for diagnosis of blurred vision.

## 2016-10-08 ENCOUNTER — Telehealth: Payer: Self-pay

## 2016-10-08 MED ORDER — POLYSACCHARIDE IRON COMPLEX 150 MG PO CAPS: 150.0000 mg | ORAL_CAPSULE | Freq: Two times a day (BID) | ORAL | 11 refills | Status: DC

## 2016-10-08 NOTE — Telephone Encounter (Signed)
Also, I don't see that the eye exam was scanned yet if it's been sent, I don't recall specifically reviewing this but I may have sent it to scan. We can request it again if we don't have it on file by his next visit

## 2016-10-08 NOTE — Telephone Encounter (Signed)
Sent Nu-Iron - is he taking the other iron supplement as well (ferrous sulfate?) he should not be using both at the same time, but choice of which to take is up to him.

## 2016-10-08 NOTE — Telephone Encounter (Addendum)
Pt is wanting a Rx for Nu-Iron 150mg  twice a day.  Dr. Ivan AnchorsHommel Rx this before he left.  Pt go this OTC he is now wanting a Rx so that it could be cheaper. Pt also wants to know have you received his office note from his eye doctor. Please advise.

## 2016-10-09 ENCOUNTER — Other Ambulatory Visit: Payer: Self-pay

## 2016-10-09 MED ORDER — POLYSACCHARIDE IRON COMPLEX 150 MG PO CAPS: 150.0000 mg | ORAL_CAPSULE | Freq: Two times a day (BID) | ORAL | 11 refills | Status: DC

## 2016-10-10 NOTE — Telephone Encounter (Signed)
Pt.notified

## 2016-10-18 ENCOUNTER — Other Ambulatory Visit: Payer: Self-pay

## 2016-10-18 MED ORDER — DOXYCYCLINE HYCLATE 100 MG PO TABS: 100.0000 mg | ORAL_TABLET | Freq: Two times a day (BID) | ORAL | 0 refills | Status: DC

## 2016-10-23 ENCOUNTER — Inpatient Hospital Stay: Payer: Medicare Other | Admitting: Osteopathic Medicine

## 2016-10-28 ENCOUNTER — Ambulatory Visit (INDEPENDENT_AMBULATORY_CARE_PROVIDER_SITE_OTHER): Payer: Medicare Other | Admitting: Osteopathic Medicine

## 2016-10-28 ENCOUNTER — Encounter: Payer: Self-pay | Admitting: Osteopathic Medicine

## 2016-10-28 VITALS — BP 171/96 | HR 74 | Ht 72.0 in | Wt 138.0 lb

## 2016-10-28 DIAGNOSIS — I10 Essential (primary) hypertension: Secondary | ICD-10-CM

## 2016-10-28 DIAGNOSIS — I480 Paroxysmal atrial fibrillation: Secondary | ICD-10-CM | POA: Diagnosis not present

## 2016-10-28 DIAGNOSIS — J449 Chronic obstructive pulmonary disease, unspecified: Secondary | ICD-10-CM

## 2016-10-28 DIAGNOSIS — H538 Other visual disturbances: Secondary | ICD-10-CM | POA: Diagnosis not present

## 2016-10-28 MED ORDER — METOPROLOL SUCCINATE ER 25 MG PO TB24: 25.0000 mg | ORAL_TABLET | Freq: Every day | ORAL | 3 refills | Status: AC

## 2016-10-28 NOTE — Progress Notes (Signed)
HPI: Kenneth Lin is a 82 y.o. male  who presents to [name] today, 10/28/16,  for chief complaint of:  Chief Complaint  Patient presents with  . Hospitalization Follow-up    [facility] follow-up:  Discharge summary reviewed: Admitted 10/14/2016, length of stay 3 days. Diagnoses included atrial fibrillationw/ RVR and chronic respiratory failure/COPD exacerbation. Improved on BiPAP. Diltiazem was discontinued - attempted Lopressor resulting in positives and bradycardia. Lisinopril and Toprol XL were initiated, prednisone taper on discharge as well as doxycycline. Instructions to continue albuterol, Symbicort 2 puffs twice a day, Spiriva daily, daily aspirin (patient declined anticoagulation), oxygen 2 L. Hyperglycemia noted due to steroids but A1c was 4.9. Instructed to follow-up with pulmonology outpatient. Last filed vital signs 10/17/2016 showed BP 167/89, pulse 92, SaO2 96%.  Since discharge from the hospital, patient states breathing better and no chest pain. He is still taking Cardizem, he is not taking lisinopril or metoprolol. He had some concerns for low blood pressures at home, no home cuff with him in the office today but he says blood pressures reading in the 120s. Patient deals with chronic mild dizziness and generalized weakness issues, no changes. He says he called over to the office here and spoke with someone who recommended that he just stay on whenever [name] had him taking, so he is not on ACE inhibitor or beta blocker, still taking only Cardizem for blood pressure.  Vision: recently saw eye Dr. Not sure who it was. [name] At any rate, patient states that he had multiple rounds of bright lights in the eyes at that visit, thinks he may have been treated by some students, still wasn't able to see very well. He doesn't remember any diagnosis being discussed. No prescription eyeglass change that he is aware of. Still having  some trouble with distance vision.    Past medical, surgical, social and family history reviewed: Past Medical History:  Diagnosis Date  . COPD (chronic obstructive pulmonary disease) (HCC)   . Essential hypertension, benign   . Other and unspecified hyperlipidemia   . Paroxysmal atrial fibrillation (HCC)    Seeing [name] at [facility].    . Peripheral artery disease (HCC)    Left ABI of leg abnormal.     No past surgical history on file. Social History  Substance Use Topics  . Smoking status: Former Games developer  . Smokeless tobacco: Never Used  . Alcohol use No   Family History  Problem Relation Age of Onset  . Heart failure Mother   . Cancer Father      Current medication list and allergy/intolerance information reviewed:  Not consistent with hospital discharge list Current Outpatient Prescriptions  Medication Sig Dispense Refill  . albuterol (VENTOLIN HFA) 108 (90 Base) MCG/ACT inhaler INHALE TWO PUFFS BY MOUTH EVERY 4 HOURS AS NEEDED FOR WHEEZING 18 g 12  . aspirin 81 MG chewable tablet Chew by mouth.    . budesonide-formoterol (SYMBICORT) 160-4.5 MCG/ACT inhaler Inhale 2 puffs into the lungs 2 (two) times daily. 1 Inhaler 3  . diazepam (VALIUM) 5 MG tablet TAKE 1 TABLET EVERY 12 HOURS AS NEEDED ANXIETY 60 tablet 3  . diltiazem (CARDIZEM CD) 120 MG 24 hr capsule Take 1 capsule (120 mg total) by mouth daily. Replaces 180mg  formulation. 30 capsule 2  . iron polysaccharides (NU-IRON) 150 MG capsule Take 1 capsule (150 mg total) by mouth 2 (two) times daily. 60 capsule 11  . SYMBICORT 160-4.5 MCG/ACT  inhaler INHALE TWO PUFFS INTO LUNGS TWICE DAILY 1 Inhaler 2  . Tiotropium Bromide Monohydrate (SPIRIVA RESPIMAT) 2.5 MCG/ACT AERS Two inhalations/actuations daily. 1 Inhaler 11   No current facility-administered medications for this visit.    Allergies  Allergen Reactions  . Gabapentin     Dizziness, shaking, insomnia      Review of Systems:  Constitutional:  No  fever, no  chills, +recent illness, No unintentional weight changes. +chronic significant fatigue.   HEENT: No  headache, +vision change, no hearing change,   Cardiac: No  chest pain, No  pressure, No palpitations, No  Orthopnea  Respiratory:  No  shortness of breath. +chronic Cough  Gastrointestinal: No  abdominal pain, No  nausea  Neurologic: +chronic generalized weakness, No  slurred speech/focal weakness/facial droop   Exam:  BP (!) 171/96   Pulse 74   Ht 6' (1.829 m)   Wt 138 lb (62.6 kg)   BMI 18.72 kg/m   Constitutional: VS see above. General Appearance: alert, well-developed, well-nourished, NAD  Eyes: Normal lids and conjunctive, non-icteric sclera  Ears, Nose, Mouth, Throat: MMM, Normal external inspection ears/nares/mouth/lips/gums.  Neck: No masses, trachea midline.No JVD  Respiratory: Normal respiratory effort. no wheeze, no rhonchi, no rales  Cardiovascular: S1/S2 normal, no murmur, no rub/gallop auscultated. Irregularly irregular rhythm, normal rate. No lower extremity edema.   Musculoskeletal: Gait normal. No clubbing/cyanosis of digits.   Neurological: Normal balance/coordination. No tremor.    ASSESSMENT/PLAN:   COPD, moderate (HCC) - Breathing well after hospital discharge for COPD exacerbation  Paroxysmal atrial fibrillation (HCC) - A. fib currently, patient is reluctant to start metoprolol since he worries his blood pressure will drop, need to verify home BP readings, need rate control, advised patient needs to be on beta blocker, if he feels better taking half tablet he can do that as well, bring home blood pressure cuff to the office- Plan: metoprolol succinate (TOPROL-XL) 25 MG 24 hr tablet  Blurry vision - No significant change from prior, patient states that reports should have been sent over to me, will check fax [name] report has been sent. Patient cannot remember where he went and brief Google search of the address he mentioned for eye doctor was not  yielding anything he recognized as his eye care facility.   Essential hypertension - Poor control, noncompliance with hospital discharge medications, bring home blood pressure cuff to next visit to verify. Patient advised on importance of blood pressure controlled to prevent worsening of cardiac issues, beta blocker to avoid A. fib/RVR again, ideally would want on ACE inhibitor as well - Plan: metoprolol succinate (TOPROL-XL) 25 MG 24 hr tablet    Patient Instructions  Patients with atrial fibrillation should be on metoprolol or a similar medication to keep the heart rate from getting too high. I understand your hesitation to start a medicine like this given your concerns about it dropping your blood pressure, so we need to make sure you are getting accurate numbers at home on your own monitor. I think you need to be on at least a low dose of this medicine, called Metoprolol, so I have called it in to your pharmacy, please start this medicine right away. Continue diltiazem.   When you come back to the office in one week, please bring your home blood pressure cuff with you, please also bring the card for the eye doctor that you went to recently.     Visit summary with medication list and pertinent instructions was printed for  patient to review. All questions at time of visit were answered - patient instructed to contact office with any additional concerns. ER/RTC precautions were reviewed with the patient. Follow-up plan: Return in about 1 week (around 11/04/2016) for blood pressure follow-up BRING HOME BLOOC PRESSURE MONITOR.

## 2016-10-28 NOTE — Patient Instructions (Addendum)
Patients with atrial fibrillation should be on metoprolol or a similar medication to keep the heart rate from getting too high. I understand your hesitation to start a medicine like this given your concerns about it dropping your blood pressure, so we need to make sure you are getting accurate numbers at home on your own monitor. I think you need to be on at least a low dose of this medicine, called Metoprolol, so I have called it in to your pharmacy, please start this medicine right away. Continue diltiazem.   When you come back to the office in one week, please bring your home blood pressure cuff with you, please also bring the card for the eye doctor that you went to recently.

## 2016-12-10 ENCOUNTER — Other Ambulatory Visit: Payer: Self-pay | Admitting: Osteopathic Medicine

## 2016-12-12 IMAGING — DX DG CHEST 2V
2 series · 2 of 2 positions shown · non-contrast
Comparison: PA and lateral chest x-ray January 04, 2016

CLINICAL DATA: One week of shortness of breath. History of COPD and
previous episodes of pneumonia. , history of atrial fibrillation,
peripheral vascular disease, former smoker.

EXAM:
CHEST  2 VIEW

[chest pa]
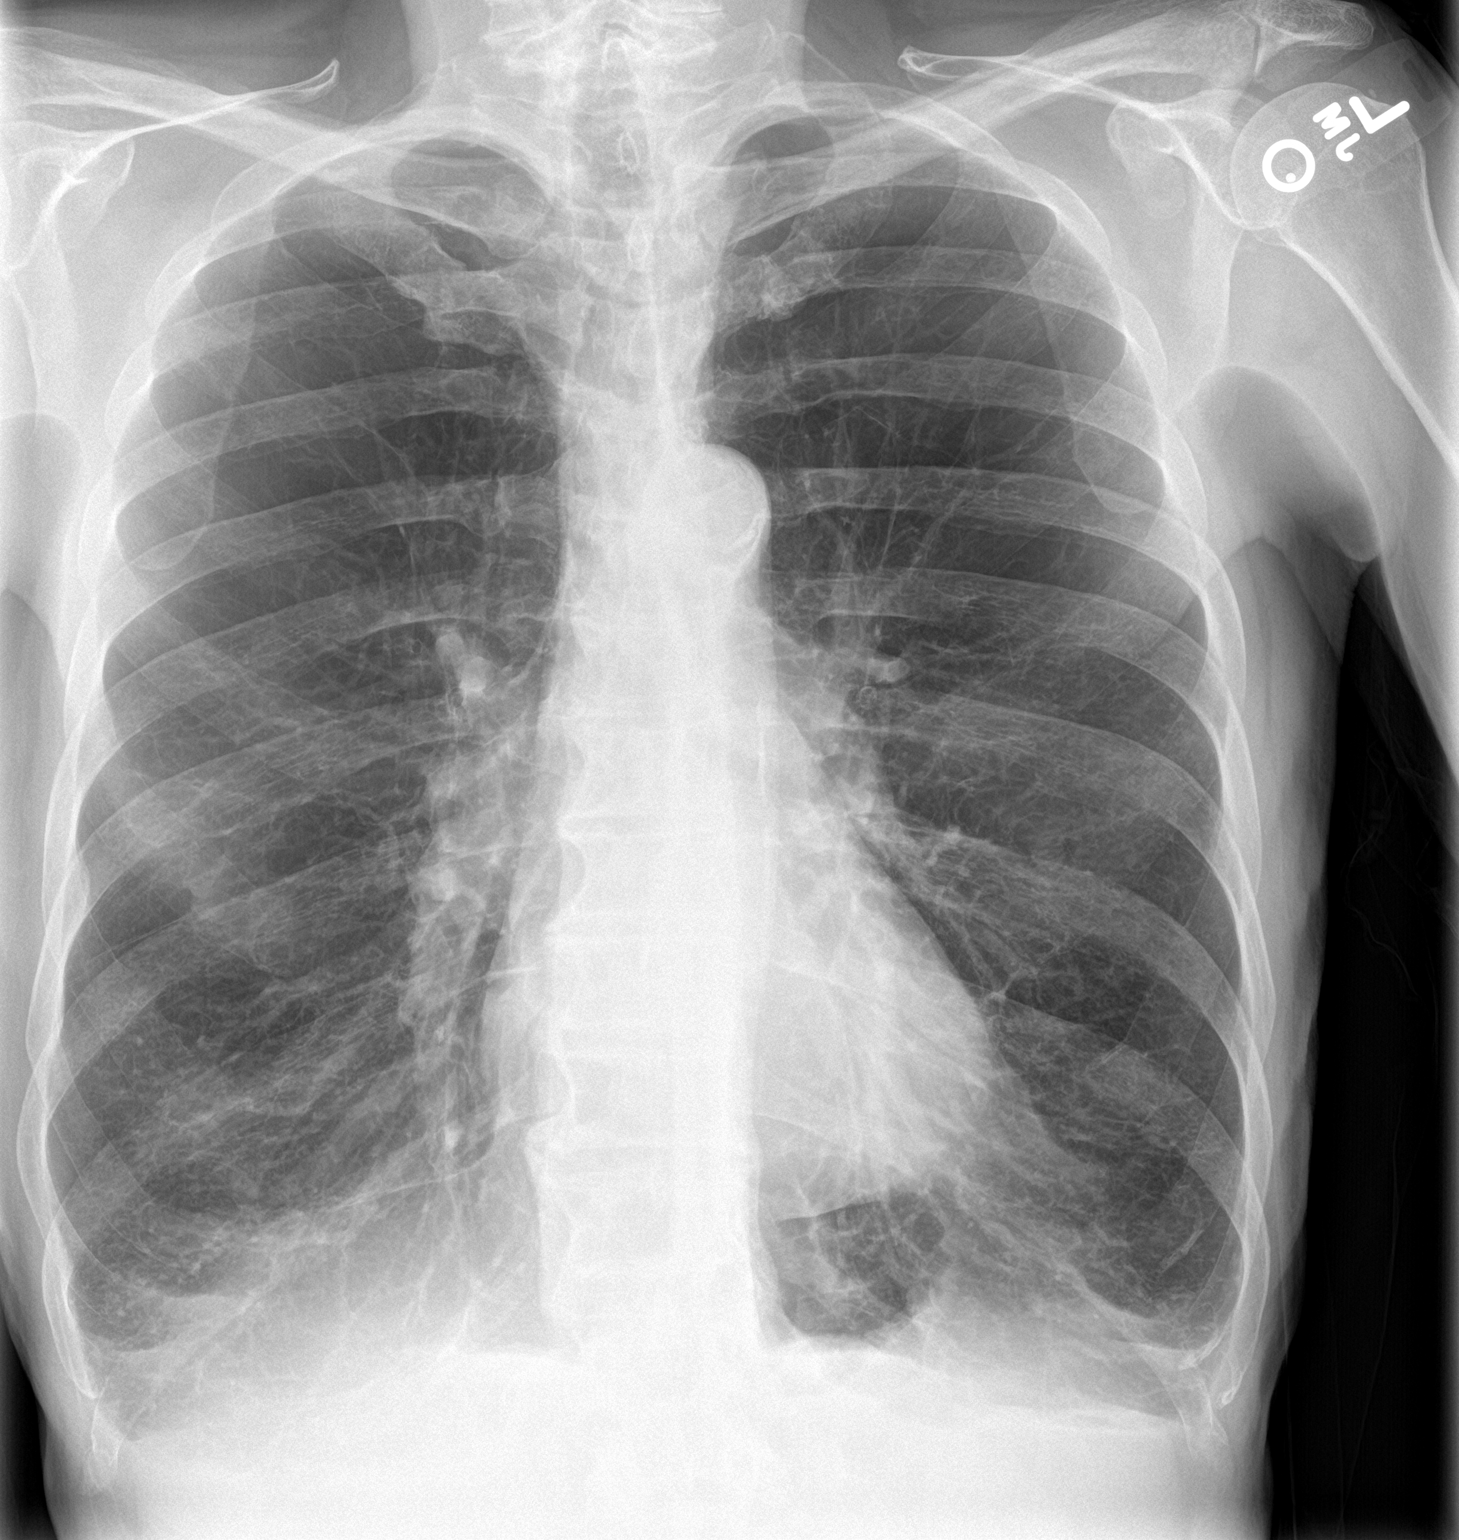

[chest lat]
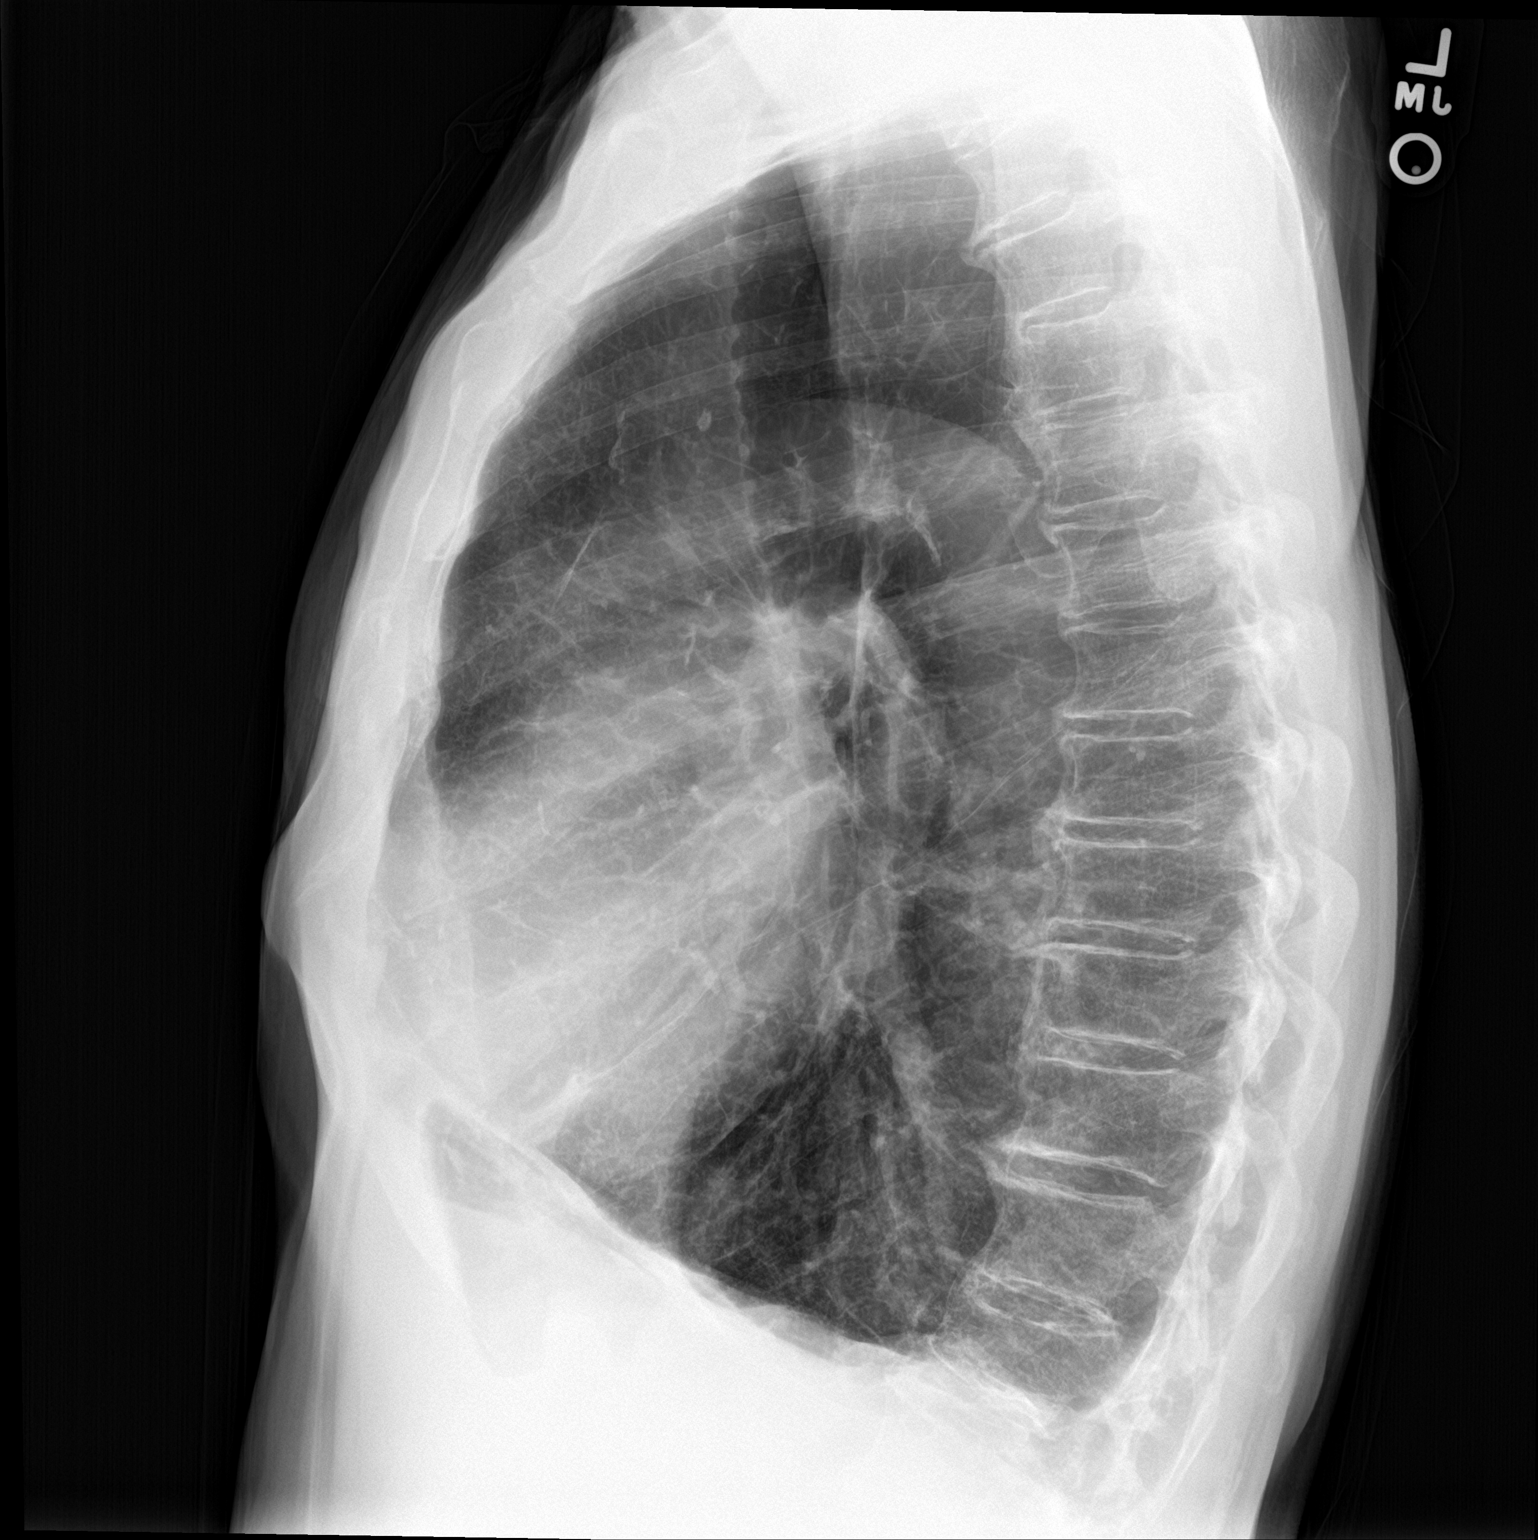

[2 of 2 positions shown; findings below may reference images not displayed]

FINDINGS: The lungs are hyperinflated with hemidiaphragm flattening. There is
no focal infiltrate. There is no pleural effusion or pneumothorax.
The heart and pulmonary vascularity are normal. The mediastinum is
normal in width. There is calcification in the wall of the thoracic
aorta. There is mild anterior wedge compression of the body of L1
which appears stable.
IMPRESSION: COPD.  No pneumonia nor other acute cardiopulmonary abnormality.

Aortic atherosclerosis.

## 2017-02-06 ENCOUNTER — Telehealth: Payer: Self-pay | Admitting: Osteopathic Medicine

## 2017-02-06 NOTE — Telephone Encounter (Signed)
Wants to know if it is safe to have a flu shot with the current meds he is taking

## 2017-02-06 NOTE — Telephone Encounter (Signed)
Flu shot should not be a problem and is strongly recommended for anyone with COPD.   Notably, he is also overdue for blood pressure follow-up, was asked to bring his home BP cuff to the office. Can we have him schedule a followup? Thanks.

## 2017-02-07 NOTE — Telephone Encounter (Signed)
PATIENT HAS BEEN INFORMED. Rhonda Cunningham,CMA  

## 2017-03-07 ENCOUNTER — Other Ambulatory Visit: Payer: Self-pay

## 2017-03-07 MED ORDER — DIAZEPAM 5 MG PO TABS: ORAL_TABLET | ORAL | 3 refills | Status: DC

## 2017-03-07 NOTE — Telephone Encounter (Signed)
Patient request refill for Diazepam. 5 mg. #60 2 refills sent to [facility]. Estelle Junehonda Janesha Brissette,CMA

## 2017-03-10 ENCOUNTER — Telehealth: Payer: Self-pay | Admitting: Osteopathic Medicine

## 2017-03-10 NOTE — Telephone Encounter (Signed)
Re: valium refill: I authorized for 30 days supply, pt overdue for followup, and I'd like to discuss continuation of this medication in more detail. Please have patient schedule a visit in the office, he is also overdue for BP follow-up per phone note from 02/06/17

## 2017-03-11 NOTE — Telephone Encounter (Signed)
Called patient advised that he needs a follow up appointment. I offered to transfer patient to scheduling and he said he will call back to schedule. Jeanie Mccard,CMA

## 2017-03-26 ENCOUNTER — Ambulatory Visit: Payer: Medicare Other | Admitting: Osteopathic Medicine

## 2017-05-05 ENCOUNTER — Other Ambulatory Visit: Payer: Self-pay | Admitting: Osteopathic Medicine

## 2017-05-06 ENCOUNTER — Other Ambulatory Visit: Payer: Self-pay | Admitting: Osteopathic Medicine

## 2017-10-20 ENCOUNTER — Other Ambulatory Visit: Payer: Self-pay | Admitting: Osteopathic Medicine

## 2017-10-20 DIAGNOSIS — J449 Chronic obstructive pulmonary disease, unspecified: Secondary | ICD-10-CM

## 2017-11-18 ENCOUNTER — Other Ambulatory Visit: Payer: Self-pay

## 2017-11-18 DIAGNOSIS — D509 Iron deficiency anemia, unspecified: Secondary | ICD-10-CM

## 2017-11-18 MED ORDER — POLYSACCHARIDE IRON COMPLEX 150 MG PO CAPS: 150.0000 mg | ORAL_CAPSULE | Freq: Two times a day (BID) | ORAL | 3 refills | Status: AC

## 2018-10-30 DEATH — deceased
